# Patient Record
Sex: Male | Born: 1970
Health system: Southern US, Community
[De-identification: ages and names within clinical notes are randomized; demographics above are authoritative.]

## PROBLEM LIST (undated history)

## (undated) DIAGNOSIS — J439 Emphysema, unspecified: Secondary | ICD-10-CM

## (undated) DIAGNOSIS — J449 Chronic obstructive pulmonary disease, unspecified: Secondary | ICD-10-CM

## (undated) HISTORY — DX: Chronic obstructive pulmonary disease, unspecified: J44.9

## (undated) HISTORY — DX: Emphysema, unspecified: J43.9

---

## 2011-02-17 ENCOUNTER — Emergency Department (HOSPITAL_COMMUNITY)
Admission: EM | Admit: 2011-02-17 | Discharge: 2011-02-17 | Discharge status: Against Medical Advice | Payer: Self-pay | Attending: Emergency Medicine | Admitting: Emergency Medicine

## 2011-02-17 DIAGNOSIS — J029 Acute pharyngitis, unspecified: Secondary | ICD-10-CM | POA: Insufficient documentation

## 2011-02-17 DIAGNOSIS — R509 Fever, unspecified: Secondary | ICD-10-CM | POA: Insufficient documentation

## 2011-02-17 LAB — RAPID STREP SCREEN (MED CTR MEBANE ONLY): Streptococcus, Group A Screen (Direct): NEGATIVE

## 2014-01-29 ENCOUNTER — Emergency Department: Payer: Self-pay | Admitting: Emergency Medicine

## 2017-11-29 ENCOUNTER — Emergency Department
Admission: EM | Admit: 2017-11-29 | Discharge: 2017-11-29 | Disposition: A | Discharge status: Home / Self Care | Payer: BLUE CROSS/BLUE SHIELD | Source: Home / Self Care | Attending: Family Medicine | Admitting: Family Medicine

## 2017-11-29 ENCOUNTER — Emergency Department (INDEPENDENT_AMBULATORY_CARE_PROVIDER_SITE_OTHER): Payer: BLUE CROSS/BLUE SHIELD

## 2017-11-29 ENCOUNTER — Encounter: Payer: Self-pay | Admitting: Emergency Medicine

## 2017-11-29 DIAGNOSIS — J40 Bronchitis, not specified as acute or chronic: Secondary | ICD-10-CM

## 2017-11-29 DIAGNOSIS — R918 Other nonspecific abnormal finding of lung field: Secondary | ICD-10-CM

## 2017-11-29 DIAGNOSIS — F17200 Nicotine dependence, unspecified, uncomplicated: Secondary | ICD-10-CM | POA: Diagnosis not present

## 2017-11-29 DIAGNOSIS — R05 Cough: Secondary | ICD-10-CM | POA: Diagnosis not present

## 2017-11-29 MED ORDER — PREDNISONE 20 MG PO TABS: ORAL_TABLET | ORAL | 0 refills | Status: DC

## 2017-11-29 MED ORDER — AZITHROMYCIN 250 MG PO TABS: ORAL_TABLET | ORAL | 0 refills | Status: DC

## 2017-11-29 NOTE — ED Triage Notes (Signed)
Pt c/o cough with mucous, chest pain and ear pressure x1 month.

## 2017-11-29 NOTE — ED Provider Notes (Signed)
[facility]    CSN: [id number] Arrival date & time: 11/29/17  1656     History   Chief Complaint Chief Complaint  Patient presents with  . Cough    HPI Anthony Rocha is a 47 y.o. male.   Patient developed a cold-like illness about one month ago and complains of a persistent non-productive cough, and persistent sinus congestion.  He denies fevers, chills, and sweats.  He denies pleuritic pain, but has tightness in his anterior chest.  He continues to smoke.   The history is provided by the patient.    History reviewed. No pertinent past medical history.  There are no active problems to display for this patient.   History reviewed. No pertinent surgical history.     Home Medications    Prior to Admission medications   Medication Sig Start Date End Date Taking? Authorizing Provider  azithromycin (ZITHROMAX Z-PAK) 250 MG tablet Take 2 tabs today; then begin one tab once daily for 4 more days. 11/29/17   Lattie HawBeese, Shaunessy Dobratz A, MD  predniSONE (DELTASONE) 20 MG tablet Take one tab by mouth twice daily for 4 days, then one daily. Take with food. 11/29/17   Lattie HawBeese, Richey Doolittle A, MD    Family History History reviewed. No pertinent family history.  Social History Social History   Tobacco Use  . Smoking status: Current Every Day Smoker  . Smokeless tobacco: Never Used  Substance Use Topics  . Alcohol use: Not on file  . Drug use: Not on file     Allergies   Patient has no allergy information on record.   Review of Systems Review of Systems No sore throat + cough No pleuritic pain, but feels tightness in anterior chest No wheezing + nasal congestion + post-nasal drainage + sinus pain/pressure No itchy/red eyes ? earache No hemoptysis No SOB No fever/chills No nausea No vomiting No abdominal pain No diarrhea No urinary symptoms No skin rash + fatigue No myalgias No headache Used OTC meds without relief   Physical Exam Triage Vital  Signs ED Triage Vitals  Enc Vitals Group     BP 11/29/17 1729 133/86     Pulse Rate 11/29/17 1729 82     Resp --      Temp 11/29/17 1729 98.3 F (36.8 C)     Temp Source 11/29/17 1729 Oral     SpO2 11/29/17 1729 97 %     Weight 11/29/17 1730 190 lb (86.2 kg)     Height --      Head Circumference --      Peak Flow --      Pain Score 11/29/17 1730 0     Pain Loc --      Pain Edu? --      Excl. in GC? --    No data found.  Updated Vital Signs BP 133/86 (BP Location: Right Arm)   Pulse 82   Temp 98.3 F (36.8 C) (Oral)   Wt 190 lb (86.2 kg)   SpO2 97%   Visual Acuity Right Eye Distance:   Left Eye Distance:   Bilateral Distance:    Right Eye Near:   Left Eye Near:    Bilateral Near:     Physical Exam Nursing notes and Vital Signs reviewed. Appearance:  Patient appears stated age, and in no acute distress Eyes:  Pupils are equal, round, and reactive to light and accomodation.  Extraocular movement is intact.  Conjunctivae are not inflamed  Ears:  Canals normal.  Tympanic membranes normal.  Nose:  Mildly congested turbinates.  No sinus tenderness.  Pharynx:  Normal Neck:  Supple. No adenopathy. Lungs:  Clear to auscultation.  Breath sounds are equal.  Moving air well. Heart:  Regular rate and rhythm without murmurs, rubs, or gallops.  Abdomen:  Nontender without masses or hepatosplenomegaly.  Bowel sounds are present.  No CVA or flank tenderness.  Extremities:  No edema.  Skin:  No rash present.    UC Treatments / Results  Labs (all labs ordered are listed, but only abnormal results are displayed) Labs Reviewed - No data to display  EKG  EKG Interpretation None       Radiology Dg Chest 2 View  Result Date: 11/29/2017 CLINICAL DATA:  Persistent cough for 1 month.  Current smoker. EXAM: CHEST - 2 VIEW COMPARISON:  None. FINDINGS: Normal heart size. Normal mediastinal contour. No pneumothorax. No pleural effusion. Mildly hyperinflated lungs. No pulmonary  edema. Tiny nodular densities throughout both lungs measuring up to 3 mm in size. No acute consolidative airspace disease. No pulmonary edema. IMPRESSION: 1. Nonspecific tiny nodular densities throughout both lungs measuring up to 3 mm in size. While most likely to represent tiny benign granulomas, nonspecific bronchiolitis cannot be excluded. Recommend either short-term follow-up PA and lateral chest radiographs in 2-3 months or further evaluation with chest CT, as clinically warranted. 2. No acute consolidative airspace disease to suggest a pneumonia. 3. Mildly hyperinflated lungs, suggesting COPD. Electronically Signed   By: Delbert Phenix M.D.   On: 11/29/2017 18:06    Procedures Procedures (including critical care time)  Medications Ordered in UC Medications - No data to display   Initial Impression / Assessment and Plan / UC Course  I have reviewed the triage vital signs and the nursing notes.  Pertinent labs & imaging results that were available during my care of the patient were reviewed by me and considered in my medical decision making (see chart for details).    Note abnormal chest x-ray findings. Begin Z-pak for atypical coverage, and prednisone burst/taper. Take plain guaifenesin (1200mg  extended release tabs such as Mucinex) twice daily, with plenty of water, for cough and congestion.   Get adequate rest.   For nasal congestion may use Afrin nasal spray (or generic oxymetazoline) each morning for about 5 days and then discontinue.  Also recommend using saline nasal spray several times daily and saline nasal irrigation (AYR is a common brand).  Use Flonase nasal spray each morning after using Afrin nasal spray and saline nasal irrigation. Try warm salt water gargles for sore throat.  Stop all antihistamines for now, and other non-prescription cough/cold preparations. May take Delsym Cough Suppressant at bedtime for nighttime cough.   Recommend follow-up with PCP for repeat chest  X-ray about 2 months, and to establish care.  Recommend smoking cessation program.    Final Clinical Impressions(s) / UC Diagnoses   Final diagnoses:  Bronchitis    ED Discharge Orders        Ordered    azithromycin (ZITHROMAX Z-PAK) 250 MG tablet     11/29/17 1829    predniSONE (DELTASONE) 20 MG tablet     11/29/17 1829          Lattie Haw, MD 12/01/17 1237

## 2017-11-29 NOTE — Discharge Instructions (Signed)
Take plain guaifenesin (1200mg  extended release tabs such as Mucinex) twice daily, with plenty of water, for cough and congestion.   Get adequate rest.   For nasal congestion may use Afrin nasal spray (or generic oxymetazoline) each morning for about 5 days and then discontinue.  Also recommend using saline nasal spray several times daily and saline nasal irrigation (AYR is a common brand).  Use Flonase nasal spray each morning after using Afrin nasal spray and saline nasal irrigation. Try warm salt water gargles for sore throat.  Stop all antihistamines for now, and other non-prescription cough/cold preparations. May take Delsym Cough Suppressant at bedtime for nighttime cough.

## 2017-12-18 ENCOUNTER — Emergency Department
Admission: EM | Admit: 2017-12-18 | Discharge: 2017-12-18 | Disposition: A | Discharge status: Home / Self Care | Payer: BLUE CROSS/BLUE SHIELD | Source: Home / Self Care

## 2017-12-18 ENCOUNTER — Emergency Department (INDEPENDENT_AMBULATORY_CARE_PROVIDER_SITE_OTHER): Payer: BLUE CROSS/BLUE SHIELD

## 2017-12-18 ENCOUNTER — Encounter: Payer: Self-pay | Admitting: Emergency Medicine

## 2017-12-18 DIAGNOSIS — R05 Cough: Secondary | ICD-10-CM | POA: Diagnosis not present

## 2017-12-18 DIAGNOSIS — J439 Emphysema, unspecified: Secondary | ICD-10-CM | POA: Diagnosis not present

## 2017-12-18 DIAGNOSIS — J441 Chronic obstructive pulmonary disease with (acute) exacerbation: Secondary | ICD-10-CM

## 2017-12-18 MED ORDER — ALBUTEROL SULFATE HFA 108 (90 BASE) MCG/ACT IN AERS: 1.0000 | INHALATION_SPRAY | Freq: Four times a day (QID) | RESPIRATORY_TRACT | 0 refills | Status: DC | PRN

## 2017-12-18 MED ORDER — DOXYCYCLINE HYCLATE 100 MG PO CAPS: 100.0000 mg | ORAL_CAPSULE | Freq: Two times a day (BID) | ORAL | 0 refills | Status: DC

## 2017-12-18 MED ORDER — PREDNISONE 50 MG PO TABS: 50.0000 mg | ORAL_TABLET | Freq: Every day | ORAL | 0 refills | Status: DC

## 2017-12-18 MED ORDER — IPRATROPIUM-ALBUTEROL 0.5-2.5 (3) MG/3ML IN SOLN
3.0000 mL | Freq: Four times a day (QID) | RESPIRATORY_TRACT | Status: DC
Start: 2017-12-18 — End: 2017-12-18
  Administered 2017-12-18: 3 mL via RESPIRATORY_TRACT

## 2017-12-18 NOTE — ED Triage Notes (Signed)
Pt states he took meds given at 3/11 visit. Felt better then sxs returned this week. Cough and sinus pressure. Afebrile.

## 2017-12-20 NOTE — ED Provider Notes (Signed)
[facility]    CSN: [id number] Arrival date & time: 12/18/17  1250     History   Chief Complaint Chief Complaint  Patient presents with  . Facial Pain    HPI Anthony Rocha is a 47 y.o. male.   The history is provided by the patient. No language interpreter was used.  Shortness of Breath  Severity:  Moderate Onset quality:  Gradual Duration:  3 weeks Timing:  Constant Progression:  Worsening Chronicity:  New Context: not activity   Relieved by:  Nothing Worsened by:  Smoke exposure Ineffective treatments:  None tried Associated symptoms: wheezing   Associated symptoms: no abdominal pain   Risk factors: tobacco use   Pt reports he was treated 2 weeks ago for bronchitis.  Pt reports he felt better after taking antibiotics and prednisone.  Pt reports symptoms have returned and he has increased shortness of breath.   History reviewed. No pertinent past medical history.  There are no active problems to display for this patient.   History reviewed. No pertinent surgical history.     Home Medications    Prior to Admission medications   Medication Sig Start Date End Date Taking? Authorizing Provider  albuterol (PROVENTIL HFA;VENTOLIN HFA) 108 (90 Base) MCG/ACT inhaler Inhale 1-2 puffs into the lungs every 6 (six) hours as needed for wheezing or shortness of breath. 12/18/17   Elson Areas, PA-C  doxycycline (VIBRAMYCIN) 100 MG capsule Take 1 capsule (100 mg total) by mouth 2 (two) times daily. 12/18/17   Elson Areas, PA-C  predniSONE (DELTASONE) 50 MG tablet Take 1 tablet (50 mg total) by mouth daily. 12/18/17   Elson Areas, PA-C    Family History History reviewed. No pertinent family history.  Social History Social History   Tobacco Use  . Smoking status: Current Every Day Smoker    Types: Cigarettes  . Smokeless tobacco: Never Used  Substance Use Topics  . Alcohol use: Yes  . Drug use: Not on file     Allergies   Patient has  no allergy information on record.   Review of Systems Review of Systems  Respiratory: Positive for shortness of breath and wheezing.   Gastrointestinal: Negative for abdominal pain.  All other systems reviewed and are negative.    Physical Exam Triage Vital Signs ED Triage Vitals  Enc Vitals Group     BP 12/18/17 1326 111/70     Pulse Rate 12/18/17 1326 95     Resp --      Temp 12/18/17 1326 98.6 F (37 C)     Temp Source 12/18/17 1326 Oral     SpO2 12/18/17 1326 98 %     Weight 12/18/17 1327 186 lb (84.4 kg)     Height --      Head Circumference --      Peak Flow --      Pain Score 12/18/17 1327 0     Pain Loc --      Pain Edu? --      Excl. in GC? --    No data found.  Updated Vital Signs BP 111/70 (BP Location: Right Arm)   Pulse 95   Temp 98.6 F (37 C) (Oral)   Wt 186 lb (84.4 kg)   SpO2 98%   Visual Acuity Right Eye Distance:   Left Eye Distance:   Bilateral Distance:    Right Eye Near:   Left Eye Near:    Bilateral Near:  Physical Exam  Constitutional: He appears well-developed and well-nourished.  HENT:  Head: Normocephalic and atraumatic.  Eyes: Conjunctivae are normal.  Neck: Neck supple.  Cardiovascular: Normal rate and regular rhythm.  No murmur heard. Pulmonary/Chest: Effort normal. No respiratory distress. He has wheezes.  Abdominal: Soft. There is no tenderness.  Musculoskeletal: He exhibits no edema.  Neurological: He is alert.  Skin: Skin is warm and dry.  Psychiatric: He has a normal mood and affect.  Nursing note and vitals reviewed.    UC Treatments / Results  Labs (all labs ordered are listed, but only abnormal results are displayed) Labs Reviewed - No data to display  EKG None Radiology No results found.  Procedures Procedures (including critical care time)  Medications Ordered in UC Medications - No data to display   Initial Impression / Assessment and Plan / UC Course  I have reviewed the triage vital  signs and the nursing notes.  Pertinent labs & imaging results that were available during my care of the patient were reviewed by me and considered in my medical decision making (see chart for details).     Pt given duoneb.  Pt reports no improvement.  On reexam pt has slight decrease in wheezing.  I counseled pt on COPD and smoking.  I will treat with albuterol, doxycycline and prednisone.  Pt is advised he needs to stop smoking.  I gave pt numbers for primary care.  He will follow up for help with stopping smoking.   Final Clinical Impressions(s) / UC Diagnoses   Final diagnoses:  COPD exacerbation [facility](HCC)    ED Discharge Orders        Ordered    albuterol (PROVENTIL HFA;VENTOLIN HFA) 108 (90 Base) MCG/ACT inhaler  Every 6 hours PRN     12/18/17 1437    doxycycline (VIBRAMYCIN) 100 MG capsule  2 times daily     12/18/17 1437    predniSONE (DELTASONE) 50 MG tablet  Daily     12/18/17 1437      An After Visit Summary was printed and given to the patient.  Controlled Substance Prescriptions [place] Controlled Substance Registry consulted? Not Applicable   Elson AreasSofia, Leslie K, [place]PA-C 12/20/17 [id number]

## 2017-12-29 ENCOUNTER — Encounter: Payer: Self-pay | Admitting: Osteopathic Medicine

## 2017-12-29 ENCOUNTER — Encounter (INDEPENDENT_AMBULATORY_CARE_PROVIDER_SITE_OTHER): Payer: Self-pay

## 2017-12-29 ENCOUNTER — Ambulatory Visit (INDEPENDENT_AMBULATORY_CARE_PROVIDER_SITE_OTHER): Payer: BLUE CROSS/BLUE SHIELD | Admitting: Osteopathic Medicine

## 2017-12-29 VITALS — BP 130/94 | HR 87 | Temp 98.4°F | Ht 70.0 in | Wt 188.0 lb

## 2017-12-29 DIAGNOSIS — R350 Frequency of micturition: Secondary | ICD-10-CM | POA: Diagnosis not present

## 2017-12-29 DIAGNOSIS — J439 Emphysema, unspecified: Secondary | ICD-10-CM

## 2017-12-29 MED ORDER — VARENICLINE TARTRATE 1 MG PO TABS: 1.0000 mg | ORAL_TABLET | Freq: Two times a day (BID) | ORAL | 1 refills | Status: DC

## 2017-12-29 MED ORDER — VARENICLINE TARTRATE 0.5 MG X 11 & 1 MG X 42 PO MISC: ORAL | 0 refills | Status: DC

## 2017-12-29 NOTE — Patient Instructions (Signed)

## 2017-12-29 NOTE — Progress Notes (Signed)
HPI: Anthony Rocha is a 47 y.o. male who  has no past medical history on file.  he presents to [facility] today, 12/29/17,  for chief complaint of: New to establish COPD  Pleasant patient here to establish care. Hes quite anxious about lung disease, new issue.   COPD: new diagnosis from urgent care, see below for record review. No spirometry results available. Smoking history: 2 pdd x 30 years. Was told he might need referral to lung specialist.  Overall, he does not struggle with shortness of breath on exertion or chronic cough. He states the albuterol inhaler did not seem to make any difference in terms of his breathing.he used Chantix years ago with good success, was able to quit for a while until increased stress with going through divorce. Like to get back on Chantix.   Records reviewed  Seen twice in the past month for respiratory issues at urgent care next door.  11/29/2017: cold-like illness for about a month, persistent productive cough.abnormal chest x-ray see below, azithromycin and prednisone were prescribed  IMPRESSION:1. Nonspecific tiny nodular densities throughout both lungs measuring up to 3 mm in size. While most likely to represent tiny benign granulomas, nonspecific bronchiolitis cannot be excluded. Recommend either short-term follow-up PA and lateral chest radiographs in 2-3 months or further evaluation with chest CT, as clinically warranted. 2. No acute consolidative airspace disease to suggest a pneumonia. 3. Mildly hyperinflated lungs, suggesting COPD.  12/18/2017:shortness of breath for about 3 weeks worsening,was initially feeling better after treatment from visit above but reported returned symptoms and increased shortness of breath. CXR positive for COPD/emphysema without evidence of acute disease. Albuterol, doxycycline, prednisone were prescribed, tobacco cessation was discussed.  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   No past surgical history on file.  Social History   Tobacco Use  . Smoking status: Current Every Day Smoker    Types: Cigarettes  . Smokeless tobacco: Never Used  Substance Use Topics  . Alcohol use: Yes    No family history on file.   Current medication list and allergy/intolerance information reviewed:    Current Outpatient Medications  Medication Sig Dispense Refill  . albuterol (PROVENTIL HFA;VENTOLIN HFA) 108 (90 Base) MCG/ACT inhaler Inhale 1-2 puffs into the lungs every 6 (six) hours as needed for wheezing or shortness of breath. 1 Inhaler 0  . doxycycline (VIBRAMYCIN) 100 MG capsule Take 1 capsule (100 mg total) by mouth 2 (two) times daily. 20 capsule 0  . predniSONE (DELTASONE) 50 MG tablet Take 1 tablet (50 mg total) by mouth daily. 5 tablet 0   No current facility-administered medications for this visit.     Not on File    Review of Systems:  Constitutional:  No  fever, no chills, No recent illness, No unintentional weight changes. No significant fatigue.   HEENT: No  headache, no vision change, no hearing change, No sore throat, No  sinus pressure  Cardiac: No  chest pain, No  pressure, No palpitations, No  Orthopnea  Respiratory:  No  shortness of breath. No  Cough  Gastrointestinal: No  abdominal pain, No  nausea, No  vomiting,  No  blood in stool, No  diarrhea, No  constipation   Musculoskeletal: No  new myalgia/arthralgia  Skin: No  Rash, No other wounds/concerning lesions  Genitourinary: No  incontinence, No  abnormal genital bleeding, No abnormal genital discharge  Hem/Onc: No  easy bruising/bleeding, No  abnormal lymph node  Endocrine: No cold intolerance,  No heat intolerance. No polyuria/polydipsia/polyphagia    Neurologic: No  weakness, No  dizziness, No  slurred speech/focal weakness/facial droop  Psychiatric: No  concerns with depression, No  concerns with anxiety, No sleep problems, No mood problems  Exam:  BP (!) 130/94 (BP Location: Right Arm, Patient Position: Sitting, Cuff Size: Normal)   Pulse 87   Temp 98.4 F (36.9 C) (Oral)   Ht 5\' 10"  (1.778 m)   Wt 188 lb 0.6 oz (85.3 kg)   BMI 26.98 kg/m   Constitutional: VS see above. General Appearance: alert, well-developed, well-nourished, NAD  Eyes: Normal lids and conjunctive, non-icteric sclera  Ears, Nose, Mouth, Throat: MMM, Normal external inspection ears/nares/mouth/lips/gums.  Neck: No masses, trachea midline.   Respiratory: Normal respiratory effort. no wheeze, no rhonchi, no rales  Cardiovascular: S1/S2 normal, no murmur, no rub/gallop auscultated. RRR. No lower extremity edema  Musculoskeletal: Gait normal.   Neurological: Normal balance/coordination. No tremor.  Skin: warm, dry, intact. No rash/ulcer.    Psychiatric: Normal judgment/insight. Normal mood and affect. Oriented x3.   Personally reviewed chest x-rays from urgent care. Report on the second exam does not make mention of the nodule that was labeled on the initial x-ray, however on personal visual review it appears stable to me.   ASSESSMENT/PLAN:   Pulmonary emphysema, unspecified emphysema type (HCC) - discussed smoking cessation as most important intervention, RTC for PFT and chest x-ray. If nodule persists, would get CT chest - Plan: CBC, COMPLETE METABOLIC PANEL WITH GFR, Lipid panel  Frequent urination - Plan: Urinalysis, Routine w reflex microscopic   Meds ordered this encounter  Medications  . varenicline (CHANTIX CONTINUING MONTH PAK) 1 MG tablet    Sig: Take 1 tablet (1 mg total) by mouth 2 (two) times daily.    Dispense:  60 tablet    Refill:  1  . varenicline (CHANTIX STARTING MONTH PAK) 0.5 MG X 11 & 1 MG X 42 tablet    Sig: Take one  0.5 mg tablet by mouth once daily for 3 days, then increase to one 0.5 mg tablet twice daily for 4 days, then increase to one 1 mg tablet twice daily.    Dispense:  53 tablet    Refill:  0       Visit summary with medication list and pertinent instructions was printed for patient to review. All questions at time of visit were answered - patient instructed to contact office with any additional concerns. ER/RTC precautions were reviewed with the patient.   Follow-up plan: Return in about 1 month (around 01/26/2018) for lung function testing, repeat chest Xray.    Please note: voice recognition software was used to produce this document, and typos may escape review. Please contact Dr. Lyn HollingsheadAlexander for any needed clarifications.

## 2017-12-30 LAB — COMPLETE METABOLIC PANEL WITH GFR
AG Ratio: 2 (calc) (ref 1.0–2.5)
ALT: 22 U/L (ref 9–46)
AST: 15 U/L (ref 10–40)
Albumin: 4.3 g/dL (ref 3.6–5.1)
Alkaline phosphatase (APISO): 69 U/L (ref 40–115)
BUN: 12 mg/dL (ref 7–25)
CO2: 29 mmol/L (ref 20–32)
Calcium: 9.1 mg/dL (ref 8.6–10.3)
Chloride: 105 mmol/L (ref 98–110)
Creat: 0.92 mg/dL (ref 0.60–1.35)
GFR, Est African American: 114 mL/min/{1.73_m2} (ref >=60)
GFR, Est Non African American: 98 mL/min/{1.73_m2} (ref >=60)
Globulin: 2.2 g/dL (calc) (ref 1.9–3.7)
Glucose, Bld: 81 mg/dL (ref 65–99)
Potassium: 4.4 mmol/L (ref 3.5–5.3)
Sodium: 139 mmol/L (ref 135–146)
Total Bilirubin: 0.5 mg/dL (ref 0.2–1.2)
Total Protein: 6.5 g/dL (ref 6.1–8.1)

## 2017-12-30 LAB — URINALYSIS, ROUTINE W REFLEX MICROSCOPIC
Bilirubin Urine: NEGATIVE
Glucose, UA: NEGATIVE
Hgb urine dipstick: NEGATIVE
Ketones, ur: NEGATIVE
Leukocytes, UA: NEGATIVE
Nitrite: NEGATIVE
Protein, ur: NEGATIVE
Specific Gravity, Urine: 1.012 (ref 1.001–1.03)
pH: 7 (ref 5.0–8.0)

## 2017-12-30 LAB — CBC
HCT: 44.9 % (ref 38.5–50.0)
Hemoglobin: 15.6 g/dL (ref 13.2–17.1)
MCH: 29.2 pg (ref 27.0–33.0)
MCHC: 34.7 g/dL (ref 32.0–36.0)
MCV: 83.9 fL (ref 80.0–100.0)
MPV: 10.5 fL (ref 7.5–12.5)
Platelets: 202 10*3/uL (ref 140–400)
RBC: 5.35 10*6/uL (ref 4.20–5.80)
RDW: 13.8 % (ref 11.0–15.0)
WBC: 9.7 10*3/uL (ref 3.8–10.8)

## 2017-12-30 LAB — LIPID PANEL
Cholesterol: 196 mg/dL (ref <200)
HDL: 52 mg/dL (ref >40)
LDL Cholesterol (Calc): 123 mg/dL (calc) — ABNORMAL HIGH
Non-HDL Cholesterol (Calc): 144 mg/dL (calc) — ABNORMAL HIGH (ref <130)
Total CHOL/HDL Ratio: 3.8 (calc) (ref <5.0)
Triglycerides: 99 mg/dL (ref <150)

## 2018-01-28 ENCOUNTER — Other Ambulatory Visit: Payer: BLUE CROSS/BLUE SHIELD

## 2018-02-16 ENCOUNTER — Other Ambulatory Visit: Payer: BLUE CROSS/BLUE SHIELD

## 2018-02-18 ENCOUNTER — Ambulatory Visit (INDEPENDENT_AMBULATORY_CARE_PROVIDER_SITE_OTHER): Payer: BLUE CROSS/BLUE SHIELD

## 2018-02-18 ENCOUNTER — Ambulatory Visit (INDEPENDENT_AMBULATORY_CARE_PROVIDER_SITE_OTHER): Payer: BLUE CROSS/BLUE SHIELD | Admitting: Osteopathic Medicine

## 2018-02-18 ENCOUNTER — Encounter: Payer: Self-pay | Admitting: Internal Medicine

## 2018-02-18 VITALS — BP 134/90 | HR 82 | Ht 70.0 in | Wt 195.0 lb

## 2018-02-18 DIAGNOSIS — Z23 Encounter for immunization: Secondary | ICD-10-CM

## 2018-02-18 DIAGNOSIS — R918 Other nonspecific abnormal finding of lung field: Secondary | ICD-10-CM

## 2018-02-18 DIAGNOSIS — J449 Chronic obstructive pulmonary disease, unspecified: Secondary | ICD-10-CM | POA: Diagnosis not present

## 2018-02-18 DIAGNOSIS — R9389 Abnormal findings on diagnostic imaging of other specified body structures: Secondary | ICD-10-CM | POA: Diagnosis not present

## 2018-02-18 MED ORDER — ALBUTEROL SULFATE (2.5 MG/3ML) 0.083% IN NEBU
2.5000 mg | INHALATION_SOLUTION | Freq: Once | RESPIRATORY_TRACT | Status: AC
  Administered 2018-02-18: 2.5 mg via RESPIRATORY_TRACT

## 2018-02-18 NOTE — Addendum Note (Signed)
Addended by: Deirdre Pippins on: 02/18/2018 01:58 PM   Modules accepted: Orders

## 2018-02-18 NOTE — Progress Notes (Signed)
HPI: Anthony Rocha is a 47 y.o. male who  has no past medical history on file.  he presents to [facility] today, 02/18/18,  for chief complaint of: COPD  COPD: new diagnosis from urgent care 12/2017, see below for record review. No spirometry results available so he is here today for PFT.   Smoking history: 2 pdd x 30 years. Was told he might need referral to lung specialist.  Overall, he does not struggle with shortness of breath on exertion or chronic cough. He states the albuterol inhaler did not seem to make any difference in terms of his breathing.he used Chantix years ago with good success, was able to quit for a while until increased stress with going through divorce - we restarted Chantix and now has quit smoking, yay!   PFT/spirometry indicates severe obstruction, possible restrictive component.   Discussed inhalers, see below.  Reports he is able to perform all activities of daily living without any breathing restrictions, cough has decreased quite a bit since quitting smoking.  He really does not want to be on any medications, including inhalers       Past medical, surgical, social and family history reviewed:  Patient Active Problem List   Diagnosis Date Noted  . COPD, severe (HCC) 02/18/2018  . Abnormal chest x-ray 02/18/2018   No past surgical history on file.  Social History   Tobacco Use  . Smoking status: Former Smoker    Packs/day: 2.00    Years: 30.00    Pack years: 60.00    Types: Cigarettes    Last attempt to quit: 01/07/2018    Years since quitting: 0.1  . Smokeless tobacco: Never Used  Substance Use Topics  . Alcohol use: Yes    Comment: 12 drinks/wk   No family history on file.   Current medication list and allergy/intolerance information reviewed:    Current Outpatient Medications  Medication Sig Dispense Refill  . varenicline (CHANTIX CONTINUING MONTH PAK) 1 MG tablet Take 1 tablet (1 mg total) by  mouth 2 (two) times daily. 60 tablet 1   No current facility-administered medications for this visit.     No Known Allergies    Review of Systems:  Constitutional:  No  fever, no chills, No recent illness, No unintentional weight changes. No significant fatigue.   HEENT: No  headache, no vision change  Cardiac: No  chest pain, No  pressure, No palpitations,  Respiratory:  No  shortness of breath. +occasional Cough  Gastrointestinal: No  abdominal pain  Musculoskeletal: No new myalgia/arthralgia  Neurologic: No  weakness, No  dizziness  Psychiatric: No  concerns with depression, No  concerns with anxiety  Exam:  BP 134/90   Pulse 82   Ht  (1.778 m)   Wt 195 lb (88.5 kg)   SpO2 100%   BMI 27.98 kg/m   Constitutional: VS see above. General Appearance: alert, well-developed, well-nourished, NAD  Eyes: Normal lids and conjunctive, non-icteric sclera  Ears, Nose, Mouth, Throat: MMM, Normal external inspection ears/nares/mouth/lips/gums.  Neck: No masses, trachea midline.   Respiratory: Normal respiratory effort. no wheeze, no rhonchi, no rales, diminished breath sounds bilaterally  Cardiovascular: S1/S2 normal, no murmur, no rub/gallop auscultated. RRR. No lower extremity edema  Musculoskeletal: Gait normal.   Neurological: Normal balance/coordination. No tremor.  Skin: warm, dry, intact. No rash/ulcer.    Psychiatric: Normal judgment/insight. Normal mood and affect. Oriented x3.   Personally reviewed chest x-rays from urgent care. Report  on the second exam does not make mention of the nodule that was labeled on the initial x-ray, however on personal visual review it appears stable to me.     ASSESSMENT/PLAN:   COPD, severe (HCC) - Patient would like to hold off on inhalers/meds for now.  Advised annual flu shot, pneumonia shot now.  Has quit smoking, which is excellent!  Advised on risks versus benefits of foregoing inhaler treatment, I think probably  okay for now since he is relatively asymptomatic but he is to alert me if this changes.  We discussed follow-up protocol as well for any concern for exacerbation/respiratory illness he needs to come see me right away.- Plan: albuterol (PROVENTIL) (2.5 MG/3ML) 0.083% nebulizer solution 2.5 mg, PR EVAL OF BRONCHOSPASM  Abnormal chest x-ray - Will get repeat today, if questionable finding still present, will proceed to CT - Plan: DG Chest 2 View  Need for pneumococcal vaccination - Plan: Pneumococcal polysaccharide vaccine 23-valent greater than or equal to 2yo subcutaneous/IM       Visit summary with medication list and pertinent instructions was printed for patient to review. All questions at time of visit were answered - patient instructed to contact office with any additional concerns. ER/RTC precautions were reviewed with the patient.   Follow-up plan: Return in about 5 months (around 07/21/2018) for follow-up COPD, sooner if needed .    Please note: voice recognition software was used to produce this document, and typos may escape review. Please contact Dr. Lyn Hollingshead for any needed clarifications.

## 2018-02-21 ENCOUNTER — Ambulatory Visit (INDEPENDENT_AMBULATORY_CARE_PROVIDER_SITE_OTHER): Payer: BLUE CROSS/BLUE SHIELD

## 2018-02-21 DIAGNOSIS — R918 Other nonspecific abnormal finding of lung field: Secondary | ICD-10-CM | POA: Diagnosis not present

## 2018-02-21 DIAGNOSIS — J439 Emphysema, unspecified: Secondary | ICD-10-CM

## 2018-02-21 DIAGNOSIS — K8020 Calculus of gallbladder without cholecystitis without obstruction: Secondary | ICD-10-CM

## 2018-03-19 ENCOUNTER — Telehealth: Payer: Self-pay | Admitting: Family Medicine

## 2018-03-19 MED ORDER — PREDNISONE 50 MG PO TABS: 50.0000 mg | ORAL_TABLET | Freq: Every day | ORAL | 0 refills | Status: DC

## 2018-03-19 MED ORDER — ALBUTEROL SULFATE HFA 108 (90 BASE) MCG/ACT IN AERS
2.0000 | INHALATION_SPRAY | Freq: Four times a day (QID) | RESPIRATORY_TRACT | 0 refills | Status: DC | PRN
Start: 2018-03-19 — End: 2018-07-20

## 2018-03-19 MED ORDER — AZITHROMYCIN 250 MG PO TABS: 250.0000 mg | ORAL_TABLET | Freq: Every day | ORAL | 0 refills | Status: DC

## 2018-03-19 NOTE — Telephone Encounter (Signed)
Pt called after hours line. He has productive cough and was just diagnosed with COPD via spirometry. Plan albuterol, prednisone, and azithromycin.  Pt will call and schedule follow up.

## 2018-07-20 ENCOUNTER — Ambulatory Visit: Payer: BLUE CROSS/BLUE SHIELD | Admitting: Osteopathic Medicine

## 2018-07-20 ENCOUNTER — Encounter: Payer: Self-pay | Admitting: Osteopathic Medicine

## 2018-07-20 VITALS — BP 130/91 | HR 74 | Temp 97.4°F

## 2018-07-20 DIAGNOSIS — Z789 Other specified health status: Secondary | ICD-10-CM

## 2018-07-20 DIAGNOSIS — R9389 Abnormal findings on diagnostic imaging of other specified body structures: Secondary | ICD-10-CM

## 2018-07-20 DIAGNOSIS — J439 Emphysema, unspecified: Secondary | ICD-10-CM | POA: Diagnosis not present

## 2018-07-20 DIAGNOSIS — Z7289 Other problems related to lifestyle: Secondary | ICD-10-CM | POA: Insufficient documentation

## 2018-07-20 DIAGNOSIS — Z23 Encounter for immunization: Secondary | ICD-10-CM

## 2018-07-20 MED ORDER — ALBUTEROL SULFATE 108 (90 BASE) MCG/ACT IN AEPB: 1.0000 | INHALATION_SPRAY | RESPIRATORY_TRACT | 11 refills | Status: DC | PRN

## 2018-07-20 NOTE — Progress Notes (Signed)
HPI: Anthony Rocha is a 47 y.o. male who  has no past medical history on file.  he presents to [facility] today, 07/20/18,  for chief complaint of:  COPD follow-up  Last visit several months ago - initiated inhaler prn and Chantix for smoking cessation. Has quit cigarettes! Vapes occasionally. No breathing troubles, occasional rescue inhaler use but takes about 4 puffs before he feels it's working. No limitations to ADL's.     Past medical history, surgical history, and family history reviewed.  Current medication list and allergy/intolerance information reviewed.   (See remainder of HPI, ROS, Phys Exam below)   Immunization History  Administered Date(s) Administered  . Influenza,inj,Quad PF,6+ Mos 07/20/2018  . Influenza-Unspecified 08/21/2017  . Pneumococcal Polysaccharide-23 02/18/2018    02/21/2018 EXAM: CT CHEST WITHOUT CONTRAST  TECHNIQUE: Multidetector CT imaging of the chest was performed following the standard protocol without IV contrast. COMPARISON:  Radiographs of Feb 18, 2018.  FINDINGS: Cardiovascular: No significant vascular findings. Normal heart size. No pericardial effusion. Mediastinum/Nodes: No enlarged mediastinal or axillary lymph nodes. Thyroid gland, trachea, and esophagus demonstrate no significant findings. Lungs/Pleura: No pneumothorax or pleural effusion is noted. Multiple small calcified granulomata are noted bilaterally. Emphysematous disease is noted in the upper lobes bilaterally. Upper Abdomen: Cholelithiasis is noted without inflammation. No other abnormality seen in the upper abdomen. Musculoskeletal: No chest wall mass or suspicious bone lesions identified.  IMPRESSION: Multiple small calcified granulomata are noted bilaterally suggesting prior granulomatous disease. No acute abnormality is seen in the chest. Cholelithiasis without inflammation in visualized portion of  upper abdomen. Emphysema (ICD10-J43.9).    ASSESSMENT/PLAN:   Pulmonary emphysema, unspecified emphysema type (HCC)  Abnormal chest x-ray  Need for influenza vaccination - Plan: Flu Vaccine QUAD 6+ mos PF IM (Fluarix Quad PF)  Non-nicotine vapor product user   Meds ordered this encounter  Medications  . Albuterol Sulfate (PROAIR RESPICLICK) 108 (90 Base) MCG/ACT AEPB    Sig: Inhale 1-2 puffs into the lungs every 4 (four) hours as needed (wheeze/short of breath).    Dispense:  2 each    Refill:  11   Trial respiclick device or pt educated on spacer use. If symptoms worsen, please come see me and we can discuss maintenance inhaler, which he declines at this time.    Follow-up plan: Return in about 6 months (around 01/19/2019) for ANNUAL PHYSICAL / WELLNESS AND ROUTINE FASTING LABS - sooner if needed! .                   ############################################ ############################################ ############################################ ############################################    Outpatient Encounter Medications as of 07/20/2018  Medication Sig  . albuterol (PROVENTIL HFA;VENTOLIN HFA) 108 (90 Base) MCG/ACT inhaler Inhale 2 puffs into the lungs every 6 (six) hours as needed for wheezing or shortness of breath.  Marland Kitchen azithromycin (ZITHROMAX) 250 MG tablet Take 1 tablet (250 mg total) by mouth daily. Take first 2 tablets together, then 1 every day until finished.  . predniSONE (DELTASONE) 50 MG tablet Take 1 tablet (50 mg total) by mouth daily.  . varenicline (CHANTIX CONTINUING MONTH PAK) 1 MG tablet Take 1 tablet (1 mg total) by mouth 2 (two) times daily.   No facility-administered encounter medications on file as of 07/20/2018.    No Known Allergies    Review of Systems:  Constitutional: No recent illness  HEENT: No  headache, no vision change  Cardiac: No  chest pain, No  pressure, No palpitations  Respiratory:  No  shortness of  breath. No  Cough  Gastrointestinal: No  abdominal pain, no change on bowel habits  Musculoskeletal: No new myalgia/arthralgia  Skin: No  Rash  Hem/Onc: No  easy bruising/bleeding, No  abnormal lumps/bumps  Neurologic: No  weakness, No  Dizziness  Psychiatric: No  concerns with depression, No  concerns with anxiety  Exam:  BP (!) 130/91   Pulse 74   Temp (!) 97.4 F (36.3 C) (Oral)   SpO2 99%   Constitutional: VS see above. General Appearance: alert, well-developed, well-nourished, NAD  Eyes: Normal lids and conjunctive, non-icteric sclera  Ears, Nose, Mouth, Throat: MMM, Normal external inspection ears/nares/mouth/lips/gums.  Neck: No masses, trachea midline.   Respiratory: Normal respiratory effort. no wheeze, no rhonchi, no rales  Cardiovascular: S1/S2 normal, no murmur, no rub/gallop auscultated. RRR.   Musculoskeletal: Gait normal. Symmetric and independent movement of all extremities  Neurological: Normal balance/coordination. No tremor.  Skin: warm, dry, intact.   Psychiatric: Normal judgment/insight. Normal mood and affect. Oriented x3.   Visit summary with medication list and pertinent instructions was printed for patient to review, advised to alert [place] if any changes needed. All questions at time of visit were answered - patient instructed to contact office with any additional concerns. ER/RTC precautions were reviewed with the patient and understanding verbalized.   Follow-up plan: Return in about 6 months (around 01/19/2019) for ANNUAL PHYSICAL / WELLNESS AND ROUTINE FASTING LABS - sooner if needed! .  Note: Total time spent 25 minutes, greater than 50% of the visit was spent face-to-face counseling and coordinating care for the following: The primary encounter diagnosis was Pulmonary emphysema, unspecified emphysema type (HCC). Diagnoses of Abnormal chest x-ray, Need for influenza vaccination, and Non-nicotine vapor product user were also pertinent to this  visit.Marland Kitchen  Please note: voice recognition software was used to produce this document, and typos may escape review. Please contact Dr. Lyn Hollingshead for any needed clarifications.

## 2018-07-21 ENCOUNTER — Ambulatory Visit: Payer: BLUE CROSS/BLUE SHIELD | Admitting: Osteopathic Medicine

## 2019-01-19 ENCOUNTER — Encounter: Payer: Self-pay | Admitting: Osteopathic Medicine

## 2019-01-19 ENCOUNTER — Ambulatory Visit (INDEPENDENT_AMBULATORY_CARE_PROVIDER_SITE_OTHER): Payer: BLUE CROSS/BLUE SHIELD | Admitting: Osteopathic Medicine

## 2019-01-19 VITALS — BP 147/83 | HR 89 | Temp 98.6°F | Wt 210.0 lb

## 2019-01-19 DIAGNOSIS — F419 Anxiety disorder, unspecified: Secondary | ICD-10-CM

## 2019-01-19 DIAGNOSIS — J449 Chronic obstructive pulmonary disease, unspecified: Secondary | ICD-10-CM

## 2019-01-19 DIAGNOSIS — Z0000 Encounter for general adult medical examination without abnormal findings: Secondary | ICD-10-CM | POA: Diagnosis not present

## 2019-01-19 DIAGNOSIS — R030 Elevated blood-pressure reading, without diagnosis of hypertension: Secondary | ICD-10-CM | POA: Diagnosis not present

## 2019-01-19 DIAGNOSIS — R351 Nocturia: Secondary | ICD-10-CM | POA: Diagnosis not present

## 2019-01-19 DIAGNOSIS — Z23 Encounter for immunization: Secondary | ICD-10-CM | POA: Diagnosis not present

## 2019-01-19 MED ORDER — ESCITALOPRAM OXALATE 5 MG PO TABS: 5.0000 mg | ORAL_TABLET | Freq: Every day | ORAL | 0 refills | Status: DC

## 2019-01-19 NOTE — Patient Instructions (Addendum)
Plan:  Will start low-dose anti-anxiety medication called Lexapro  Will follow-up in the office in 2 weeks or so: anxiety and recheck BP  Can get home blood pressure monitor and keep track of numbers. Will consider blood pressure medication based on how the numbers are looking  Bring your blood pressure monitor to the office next visit!   Will also consider starting medication for prostate enlargement which may be contributing to increased nighttime urination. No drinks, especially caffeine or alcohol, in the few hours prior to bedtime!         General Preventive Care  Most recent routine screening lipids/other labs: already done!   Blood pressure: goal 130 top number, 80 bottom number, or less   Tobacco: don't! .tob  Alcohol: responsible moderation is ok for most adults - if you have concerns about your alcohol intake, please talk to me!   Exercise: as tolerated to reduce risk of cardiovascular disease and diabetes. Strength training will also prevent osteoporosis.   Mental health: if need for mental health care (medicines, counseling, other), or concerns about moods, please let me know!   Sexual health: if need for STD testing, or if concerns with libido/pain problems, please let me know!  Advanced Directive: Living Will and/or Healthcare Power of Attorney recommended for all adults, regardless of age or health.  Vaccines  Flu vaccine: recommended for almost everyone, every fall.   Shingles vaccine: Shingrix recommended after age 46.   Pneumonia vaccines: Prevnar and Pneumovax recommended after age 38, or sooner if certain medical conditions/smokers.   Tetanus booster: Tdap recommended every 10 years.  Cancer screenings   Colon cancer screening: recommended for everyone at age 25  Prostate cancer screening: PSA blood test around age 63  Lung cancer screening: CT chest every year for those sge 65 to 49 years old with ?30 pack year smoking history, who either  currently smoke or have quit within the past 15 years. Infection screenings . HIV, Gonorrhea/Chlamydia: screening as needed. . Hepatitis C: recommended for anyone born 1945-1965, not needed for you . TB: certain at-risk populations, or depending on work requirements and/or travel history Other . Bone Density Test: recommended for men at age 16, sooner depending on risk factors . Abdominal Aortic Aneurysm: screening with ultrasound recommended once for men age 54-75 who have ever smoked

## 2019-01-19 NOTE — Progress Notes (Signed)
HPI: Anthony Rocha is a 48 y.o. male who  has no past medical history on file.  he presents to [facility] today, 01/19/19,  for chief complaint of: Annual physical  Concern for elevated BP     Patient here for annual physical / wellness exam.  See preventive care reviewed as below.  Recent labs reviewed in detail with the patient.   Additional concerns today include:   BP has been high, was high at [employer] physical, he had to lie down and wait 30 mins before it was I nok range, this was in 08/2018  Reports increased urination at night, sometimes once or twice sometimes most every hour, doesn't feel like he's fully emptying.   Reports increased anxiety over the past few months, more irritable and on edge. No situational factors he can think of (this all started around last fall/winter prior to COVID concerns).       Past medical, surgical, social and family history reviewed:  Patient Active Problem List   Diagnosis Date Noted  . Non-nicotine vapor product user 07/20/2018  . COPD, severe (HCC) 02/18/2018  . Abnormal chest x-ray 02/18/2018   No past surgical history on file.  Social History   Tobacco Use  . Smoking status: Former Smoker    Packs/day: 2.00    Years: 30.00    Pack years: 60.00    Types: Cigarettes    Last attempt to quit: 01/07/2018    Years since quitting: 1.0  . Smokeless tobacco: Never Used  Substance Use Topics  . Alcohol use: Yes    Comment: 12 drinks/wk    No family history on file.   Current medication list and allergy/intolerance information reviewed:    Current Outpatient Medications  Medication Sig Dispense Refill  . Albuterol Sulfate (PROAIR RESPICLICK) 108 (90 Base) MCG/ACT AEPB Inhale 1-2 puffs into the lungs every 4 (four) hours as needed (wheeze/short of breath). (Patient not taking: Reported on 01/19/2019) 2 each 11  . escitalopram (LEXAPRO) 5 MG tablet Take 1 tablet (5 mg total) by mouth daily.  90 tablet 0   No current facility-administered medications for this visit.     No Known Allergies    Review of Systems:  Constitutional:  No  fever, no chills, No recent illness, No unintentional weight changes. No significant fatigue.   HEENT: No  headache, no vision change, no hearing change, No sore throat, No  sinus pressure  Cardiac: No  chest pain, No  pressure, No palpitations, No  Orthopnea  Respiratory:  No  shortness of breath. No  Cough  Gastrointestinal: No  abdominal pain, No  nausea, No  vomiting,  No  blood in stool, No  diarrhea, No  constipation   Musculoskeletal: No new myalgia/arthralgia  Skin: No  Rash, No other wounds/concerning lesions  Genitourinary: No  incontinence, No  abnormal genital bleeding, No abnormal genital discharge  Hem/Onc: No  easy bruising/bleeding, No  abnormal lymph node  Endocrine: No cold intolerance,  No heat intolerance. No polyuria/polydipsia/polyphagia   Neurologic: No  weakness, No  dizziness, No  slurred speech/focal weakness/facial droop  Psychiatric: No  concerns with depression, No  concerns with anxiety, No sleep problems, No mood problems   Exam:  151/112 on intake 147/83 on repeat  BP (!) 147/83 (BP Location: Left Arm, Patient Position: Sitting, Cuff Size: Large)   Pulse 89   Temp 98.6 F (37 C) (Oral)   Wt 210 lb (95.3 kg)   BMI  30.13 kg/m   BP Readings from Last 3 Encounters:  01/19/19 (!) 147/83  07/20/18 (!) 130/91  02/18/18 134/90    Constitutional: VS see above. General Appearance: alert, well-developed, well-nourished, NAD  Eyes: Normal lids and conjunctive, non-icteric sclera  Ears, Nose, Mouth, Throat: MMM, Normal external inspection ears/nares/mouth/lips/gums. TM normal bilaterally. Pharynx/tonsils no erythema, no exudate. Nasal mucosa normal.   Neck: No masses, trachea midline. No thyroid enlargement. No tenderness/mass appreciated. No lymphadenopathy  Respiratory: Normal respiratory effort.  no wheeze, no rhonchi, no rales  Cardiovascular: S1/S2 normal, no murmur, no rub/gallop auscultated. RRR. No lower extremity edema. Pedal pulse II/IV bilaterally DP and PT. No carotid bruit or JVD. No abdominal aortic bruit.  Gastrointestinal: Nontender, no masses. No hepatomegaly, no splenomegaly. No hernia appreciated. Bowel sounds normal. Rectal exam deferred.   Musculoskeletal: Gait normal. No clubbing/cyanosis of digits.   Neurological: Normal balance/coordination. No tremor. No cranial nerve deficit on limited exam. Motor and sensation intact and symmetric. Cerebellar reflexes intact.   Skin: warm, dry, intact. No rash/ulcer. No concerning nevi or subq nodules on limited exam.    Psychiatric: Normal judgment/insight. Normal mood and affect. Oriented x3.    No results found for this or any previous visit (from the past 72 hour(s)).  No results found.   ASSESSMENT/PLAN: The primary encounter diagnosis was Annual physical exam. Diagnoses of Elevated blood pressure reading, COPD, severe (HCC), Need for diphtheria-tetanus-pertussis (Tdap) vaccine, Anxiety, and Nocturia were also pertinent to this visit.   Orders Placed This Encounter  Procedures  . Tdap vaccine greater than or equal to 7yo IM   The 10-year ASCVD risk score Denman George DC Montez Hageman., et al., 2013) is: 3.7%   Values used to calculate the score:     Age: 64 years     Sex: Male     Is Non-Hispanic African American: No     Diabetic: No     Tobacco smoker: No     Systolic Blood Pressure: 147 mmHg     Is BP treated: No     HDL Cholesterol: 52 mg/dL     Total Cholesterol: 196 mg/dL   Meds ordered this encounter  Medications  . escitalopram (LEXAPRO) 5 MG tablet    Sig: Take 1 tablet (5 mg total) by mouth daily.    Dispense:  90 tablet    Refill:  0    Patient Instructions  Plan:  Will start low-dose anti-anxiety medication called Lexapro  Will follow-up in the office in 2 weeks or so: anxiety and recheck BP  Can  get home blood pressure monitor and keep track of numbers. Will consider blood pressure medication based on how the numbers are looking  Bring your blood pressure monitor to the office next visit!   Will also consider starting medication for prostate enlargement which may be contributing to increased nighttime urination. No drinks, especially caffeine or alcohol, in the few hours prior to bedtime!         General Preventive Care  Most recent routine screening lipids/other labs: already done!   Blood pressure: goal 130 top number, 80 bottom number, or less   Tobacco: don't! .tob  Alcohol: responsible moderation is ok for most adults - if you have concerns about your alcohol intake, please talk to me!   Exercise: as tolerated to reduce risk of cardiovascular disease and diabetes. Strength training will also prevent osteoporosis.   Mental health: if need for mental health care (medicines, counseling, other), or concerns about moods, please let  me know!   Sexual health: if need for STD testing, or if concerns with libido/pain problems, please let me know!  Advanced Directive: Living Will and/or Healthcare Power of Attorney recommended for all adults, regardless of age or health.  Vaccines  Flu vaccine: recommended for almost everyone, every fall.   Shingles vaccine: Shingrix recommended after age 50.   Pneumonia vaccines: Prevnar and Pneumovax recommended after age 50, or sooner if certain medical conditions/smokers.   Tetanus booster: Tdap recommended every 10 years.  Cancer screenings   Colon cancer screening: recommended for everyone at age 50  Prostate cancer screening: PSA blood test around age 50  Lung cancer screening: CT chest every year for those sge 7055 to 50 years old with ?30 pack year smoking history, who either currently smoke or have quit within the past 15 years. Infection screenings . HIV, Gonorrhea/Chlamydia: screening as needed. . Hepatitis C: recommended  for anyone born 1945-1965, not needed for you . TB: certain at-risk populations, or depending on work requirements and/or travel history Other . Bone Density Test: recommended for men at age 50, sooner depending on risk factors . Abdominal Aortic Aneurysm: screening with ultrasound recommended once for men age 50-75 who have ever smoked             Visit summary with medication list and pertinent instructions was printed for patient to review. All questions at time of visit were answered - patient instructed to contact office with any additional concerns or updates. ER/RTC precautions were reviewed with the patient.     Please note: voice recognition software was used to produce this document, and typos may escape review. Please contact Dr. Lyn HollingsheadAlexander for any needed clarifications.     Follow-up plan: Return in about 2 weeks (around 02/02/2019) for in office - recheck BP and anxiety bring home BP monitor and recod of BP measurements .

## 2019-02-02 ENCOUNTER — Encounter: Payer: Self-pay | Admitting: Osteopathic Medicine

## 2019-02-02 ENCOUNTER — Ambulatory Visit: Payer: BLUE CROSS/BLUE SHIELD | Admitting: Osteopathic Medicine

## 2019-02-02 MED ORDER — VARENICLINE TARTRATE 1 MG PO TABS: 1.0000 mg | ORAL_TABLET | Freq: Two times a day (BID) | ORAL | 3 refills | Status: DC

## 2019-02-02 MED ORDER — VARENICLINE TARTRATE 0.5 MG X 11 & 1 MG X 42 PO MISC: ORAL | 0 refills | Status: DC

## 2019-02-07 ENCOUNTER — Other Ambulatory Visit: Payer: Self-pay | Admitting: Osteopathic Medicine

## 2019-02-08 ENCOUNTER — Other Ambulatory Visit: Payer: Self-pay

## 2019-02-08 ENCOUNTER — Encounter: Payer: Self-pay | Admitting: Osteopathic Medicine

## 2019-02-08 MED ORDER — VARENICLINE TARTRATE 1 MG PO TABS: 1.0000 mg | ORAL_TABLET | Freq: Two times a day (BID) | ORAL | 3 refills | Status: DC

## 2019-02-08 MED ORDER — VARENICLINE TARTRATE 0.5 MG X 11 & 1 MG X 42 PO MISC: ORAL | 0 refills | Status: DC

## 2019-06-05 ENCOUNTER — Encounter: Payer: Self-pay | Admitting: Osteopathic Medicine

## 2019-06-06 ENCOUNTER — Other Ambulatory Visit: Payer: Self-pay | Admitting: Osteopathic Medicine

## 2019-06-06 MED ORDER — VARENICLINE TARTRATE 1 MG PO TABS: 1.0000 mg | ORAL_TABLET | Freq: Two times a day (BID) | ORAL | 3 refills | Status: DC

## 2019-08-25 IMAGING — CT CT CHEST W/O CM
2 of 3 series · 15 of 36 positions shown, 18 images · non-contrast
Comparison: Radiographs February 18, 2018.

CLINICAL DATA: Possible pulmonary nodules.

EXAM:
CT CHEST WITHOUT CONTRAST
TECHNIQUE: Multidetector CT imaging of the chest was performed following the
standard protocol without IV contrast.

[Series 2: thorax · axial · 0.83mm/px · z∈[-352,-52]mm · 12 of 72 slices shown, 15 images]
[im 6/72  mediastinal]
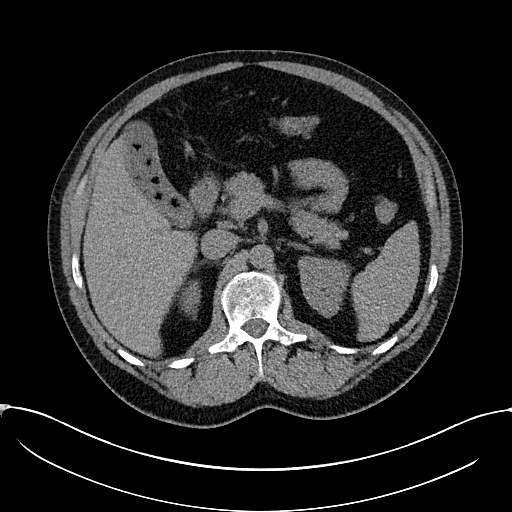
[im 6/72  lung]
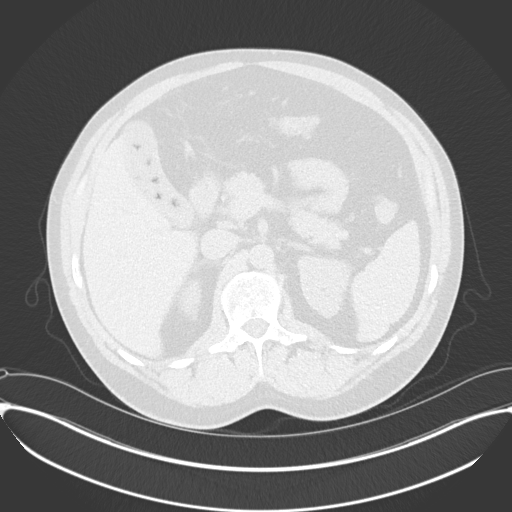
[im 11/72  lung]
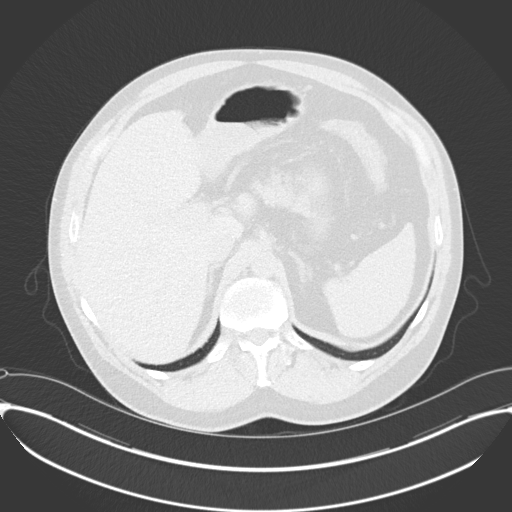
[im 16/72  lung]
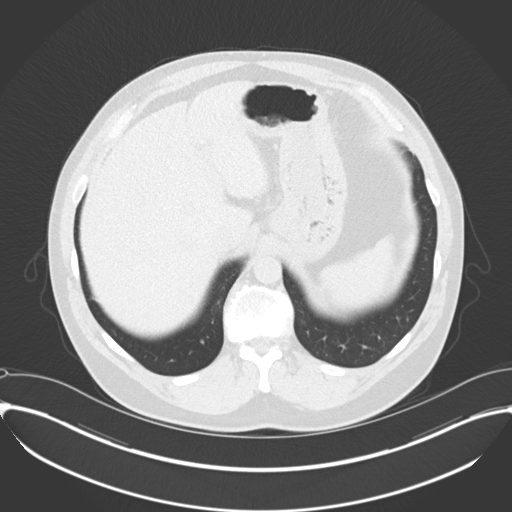
[im 22/72  lung]
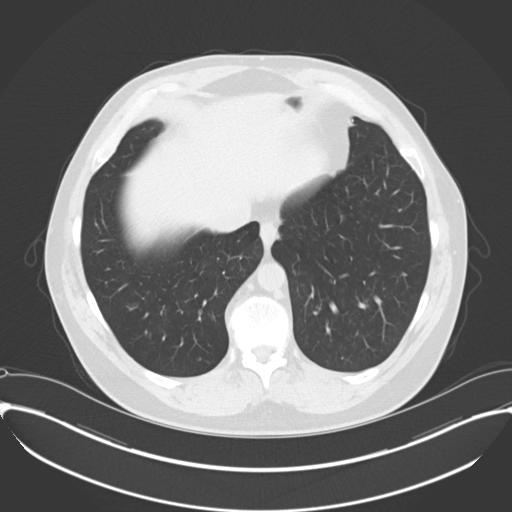
[im 27/72  mediastinal]
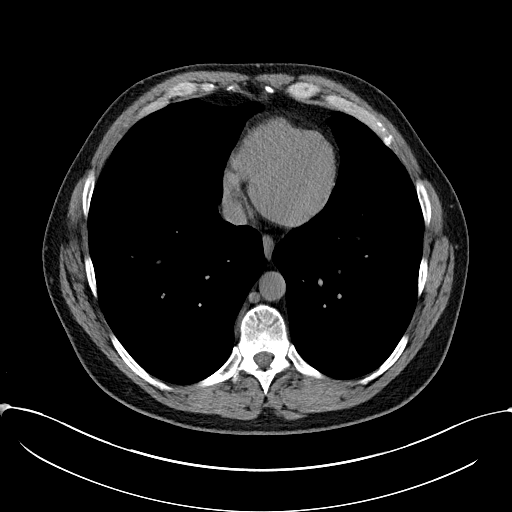
[im 27/72  lung]
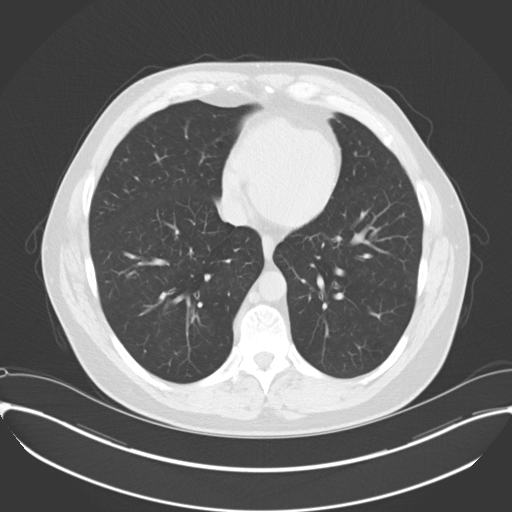
[im 32/72  lung]
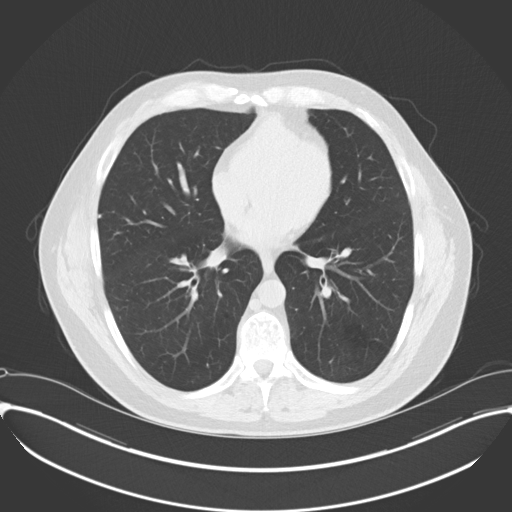
[im 40/72  lung]
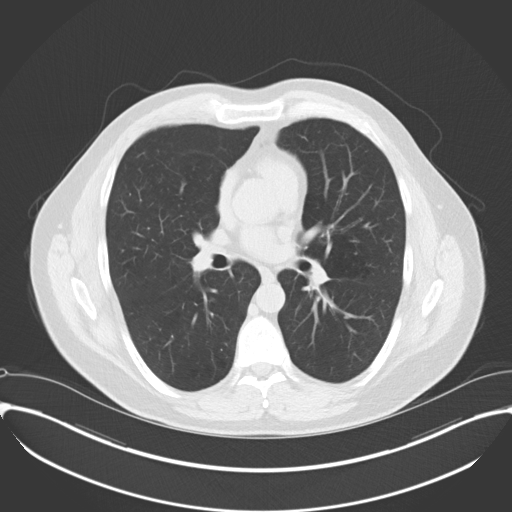
[im 45/72  lung]
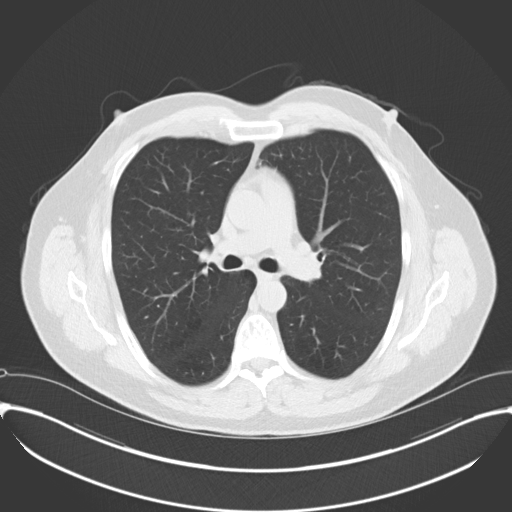
[im 50/72  mediastinal]
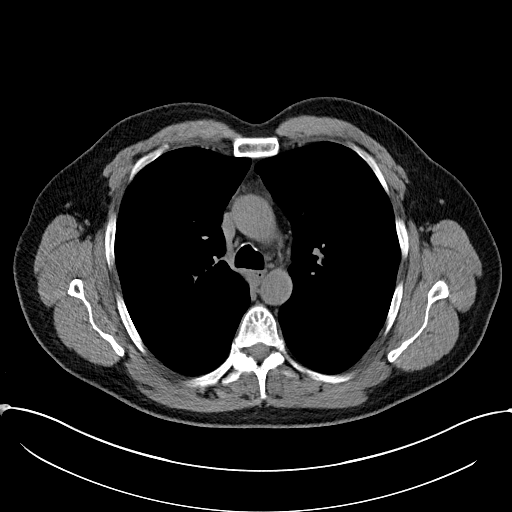
[im 50/72  lung]
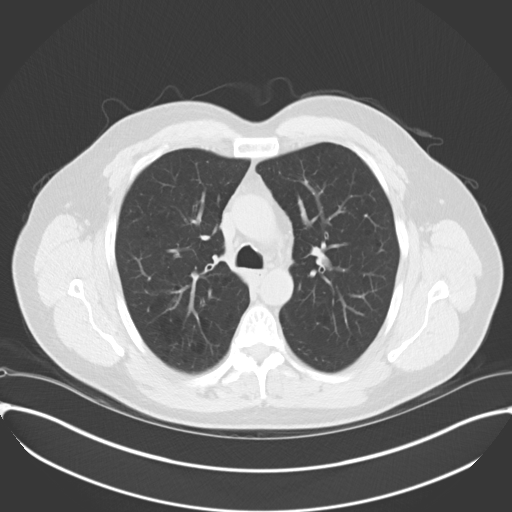
[im 56/72  lung]
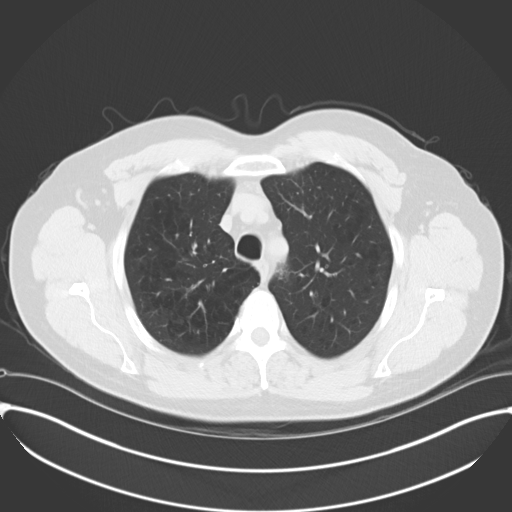
[im 61/72  lung]
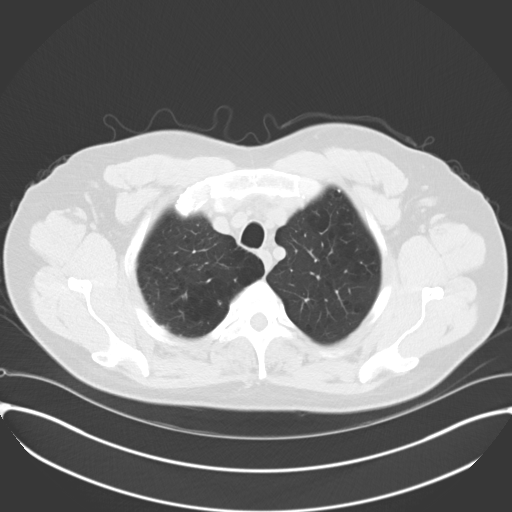
[im 66/72  lung]
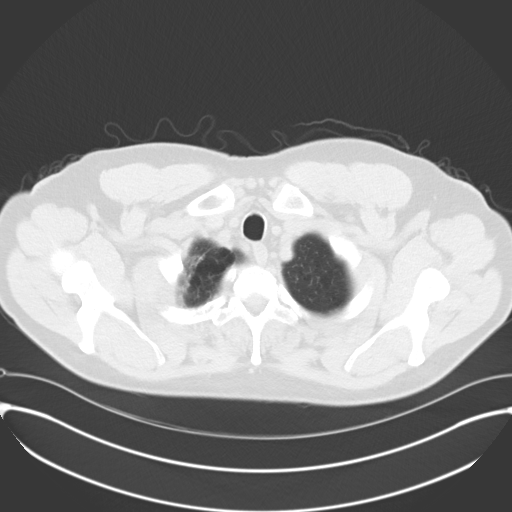

[Series 5: coronal · coronal · 0.71mm/px · 3 of 163 slices shown]
[im 33/163  lung]
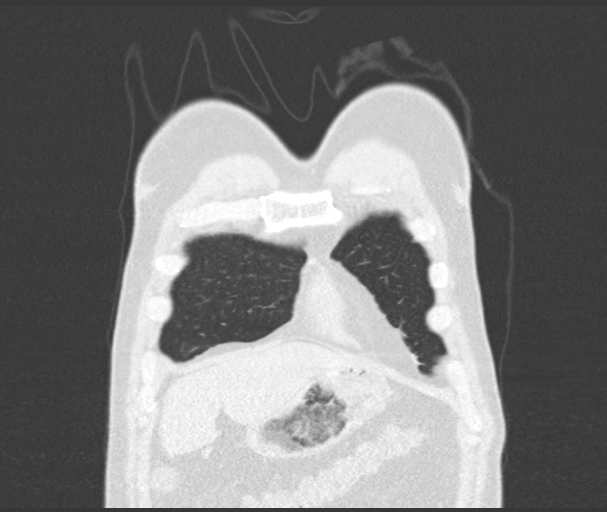
[im 65/163  lung]
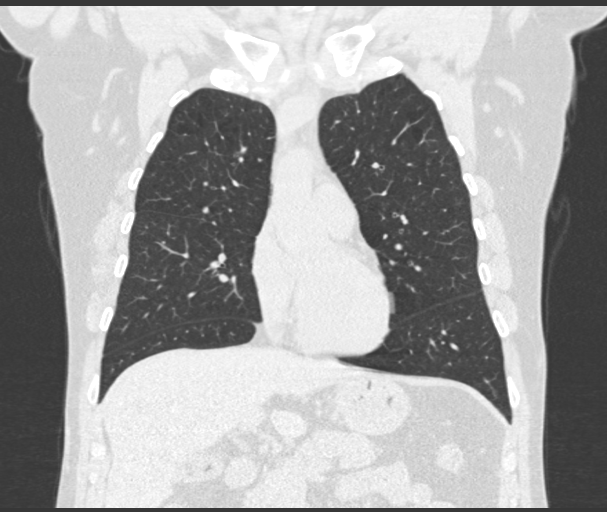
[im 98/163  lung]
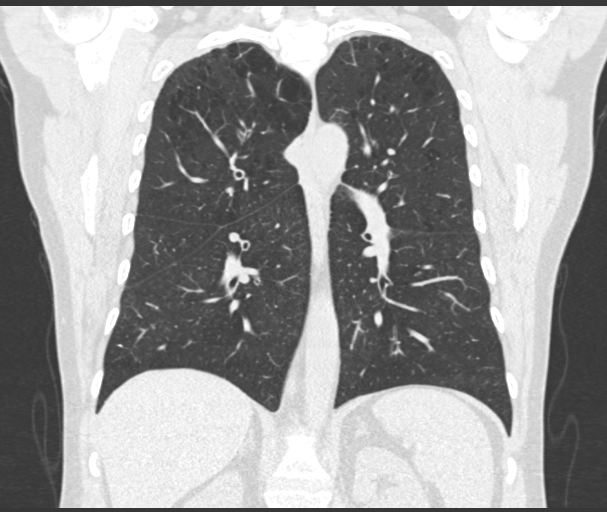

[15 of 36 positions shown; findings below may reference images not displayed]

FINDINGS: Cardiovascular: No significant vascular findings. Normal heart size.
No pericardial effusion.

Mediastinum/Nodes: No enlarged mediastinal or axillary lymph nodes.
Thyroid gland, trachea, and esophagus demonstrate no significant
findings.

Lungs/Pleura: No pneumothorax or pleural effusion is noted. Multiple
small calcified granulomata are noted bilaterally. Emphysematous
disease is noted in the upper lobes bilaterally.

Upper Abdomen: Cholelithiasis is noted without inflammation. No
other abnormality seen in the upper abdomen.

Musculoskeletal: No chest wall mass or suspicious bone lesions
identified.
IMPRESSION: Multiple small calcified granulomata are noted bilaterally
suggesting prior granulomatous disease.

No acute abnormality is seen in the chest.

Cholelithiasis without inflammation in visualized portion of upper
abdomen.

Emphysema ([id number]).

## 2019-10-18 IMAGING — DX DG CHEST 2V
2 series · 2 of 2 positions shown · non-contrast
Comparison: None.

CLINICAL DATA: Persistent cough for 1 month.  Current smoker.

EXAM:
CHEST - 2 VIEW

[chest pa]
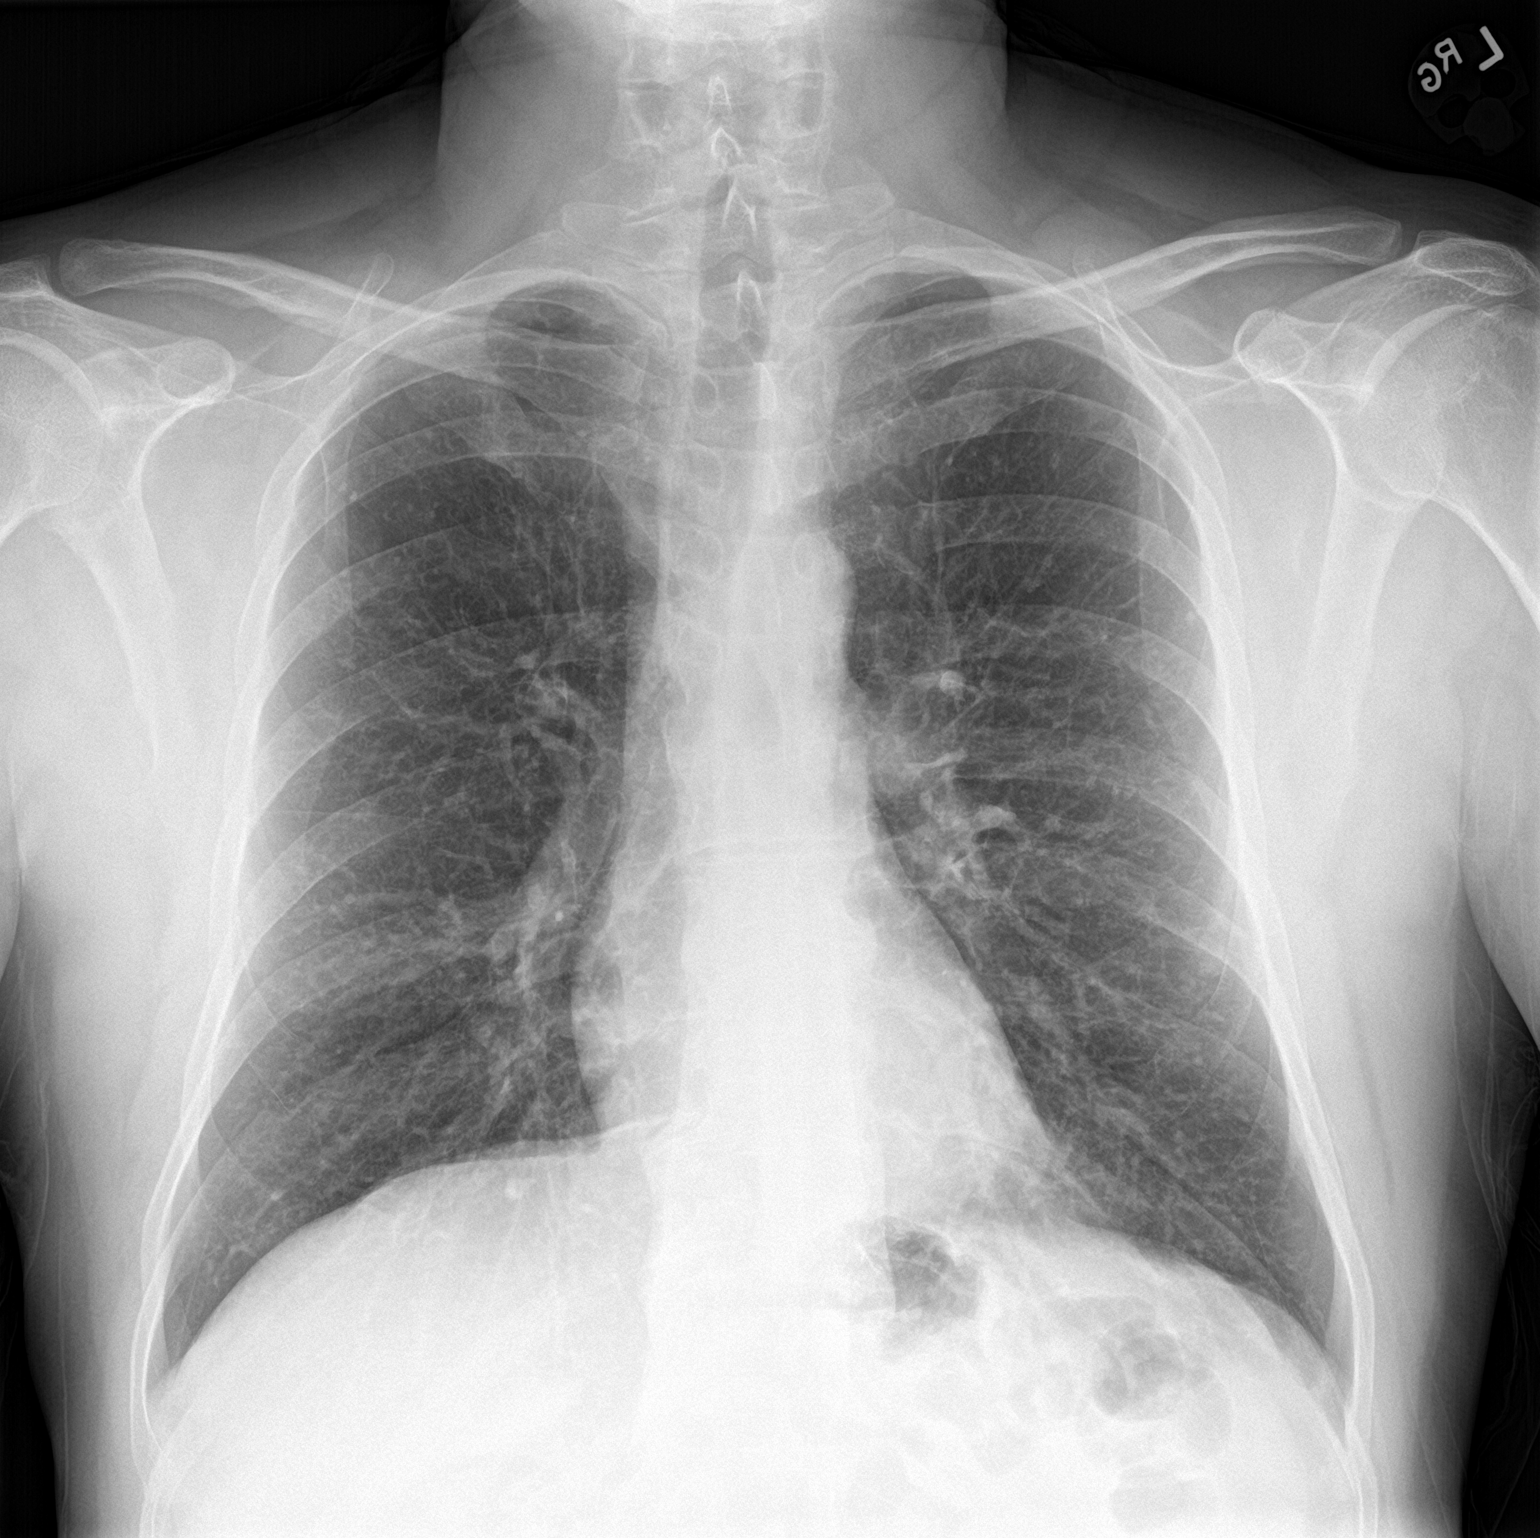

[chest lat]
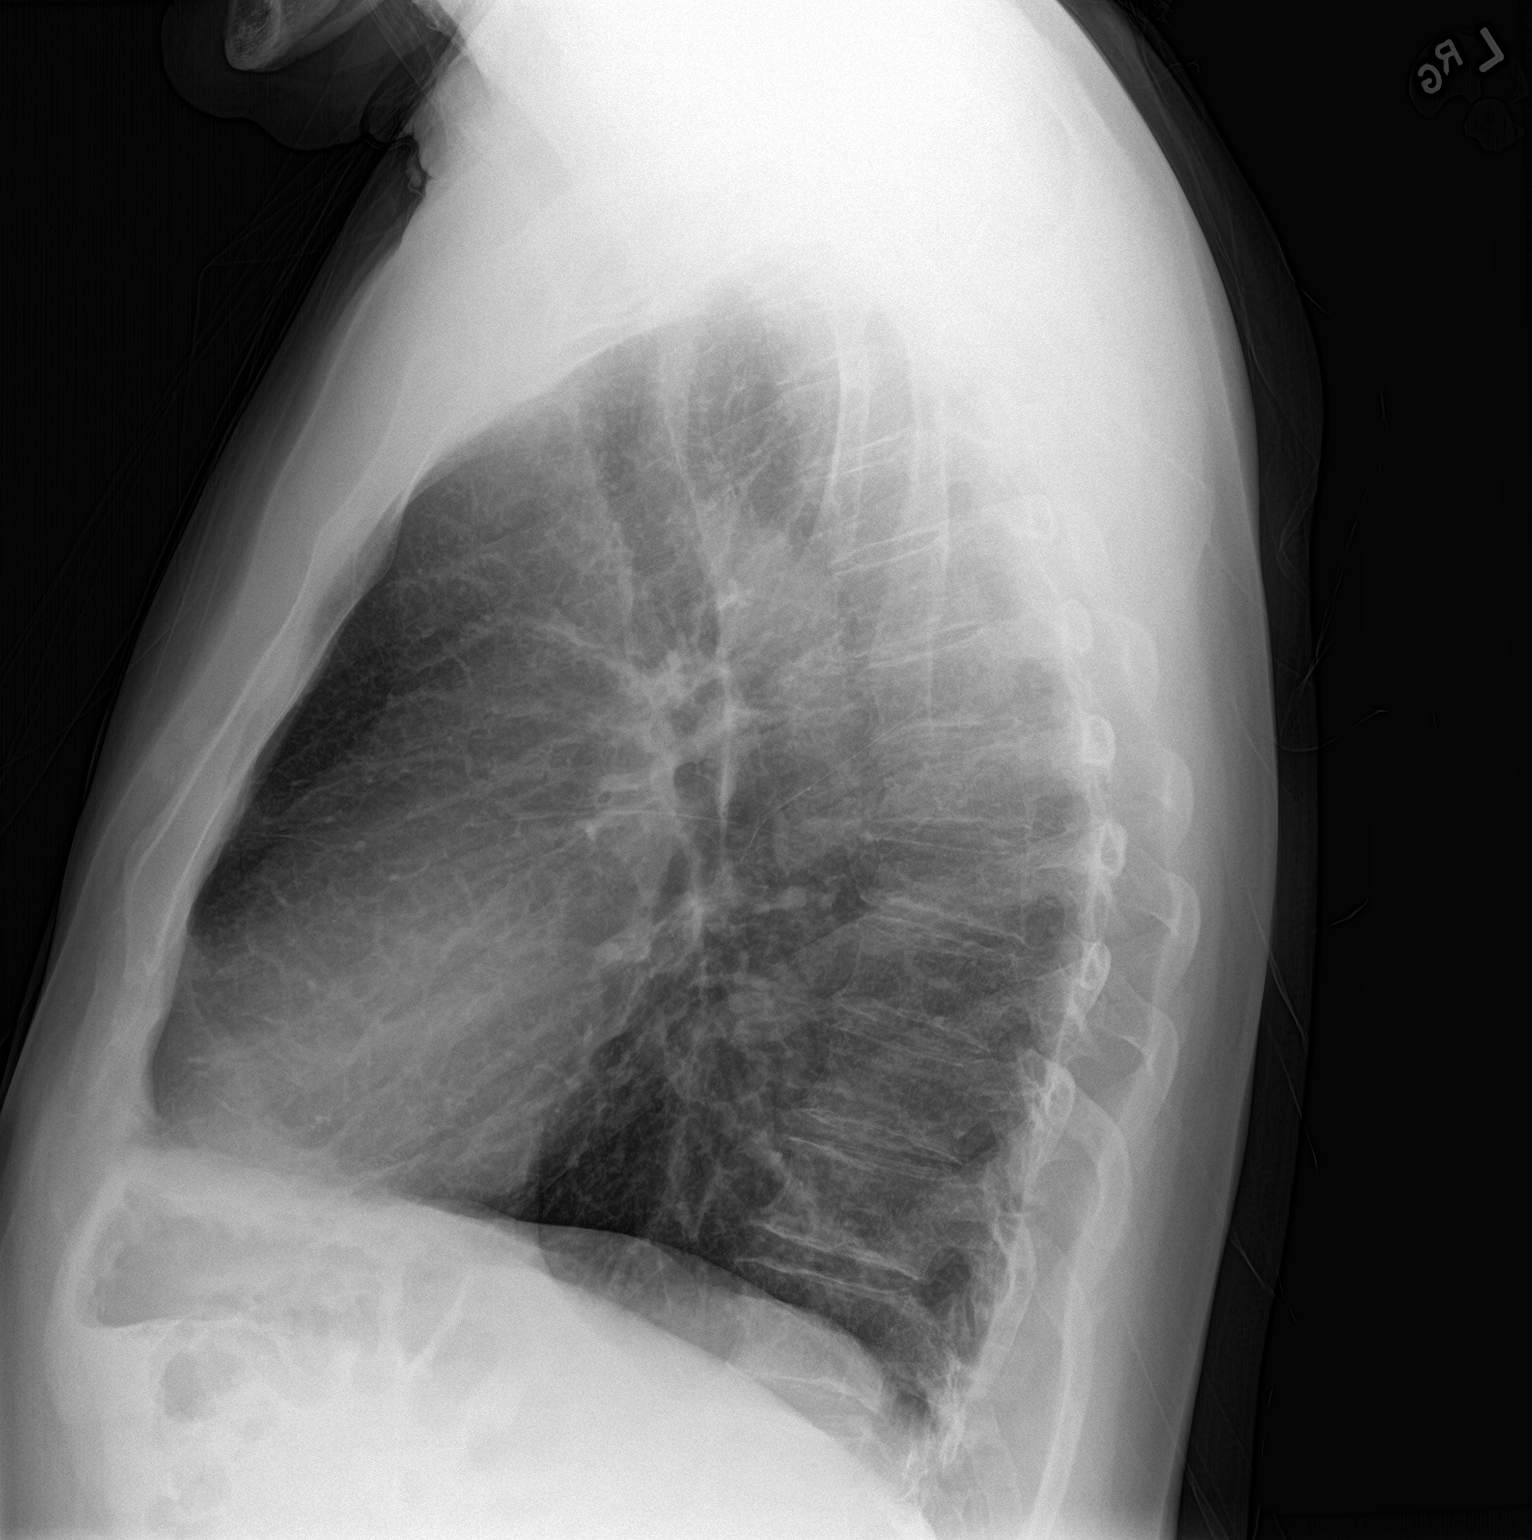

[2 of 2 positions shown; findings below may reference images not displayed]

FINDINGS: Normal heart size. Normal mediastinal contour. No pneumothorax. No
pleural effusion. Mildly hyperinflated lungs. No pulmonary edema.
Tiny nodular densities throughout both lungs measuring up to 3 mm in
size. No acute consolidative airspace disease. No pulmonary edema.
IMPRESSION: 1. Nonspecific tiny nodular densities throughout both lungs
measuring up to 3 mm in size. While most likely to represent tiny
benign granulomas, nonspecific bronchiolitis cannot be excluded.
Recommend either short-term follow-up PA and lateral chest
radiographs in 2-3 months or further evaluation with chest CT, as
clinically warranted.
2. No acute consolidative airspace disease to suggest a pneumonia.
3. Mildly hyperinflated lungs, suggesting COPD.

## 2020-02-06 ENCOUNTER — Other Ambulatory Visit: Payer: Self-pay

## 2020-02-06 ENCOUNTER — Encounter: Payer: Self-pay | Admitting: Osteopathic Medicine

## 2020-02-06 ENCOUNTER — Telehealth (INDEPENDENT_AMBULATORY_CARE_PROVIDER_SITE_OTHER): Payer: BC Managed Care – PPO | Admitting: Osteopathic Medicine

## 2020-02-06 VITALS — Wt 220.0 lb

## 2020-02-06 DIAGNOSIS — J441 Chronic obstructive pulmonary disease with (acute) exacerbation: Secondary | ICD-10-CM | POA: Diagnosis not present

## 2020-02-06 DIAGNOSIS — J329 Chronic sinusitis, unspecified: Secondary | ICD-10-CM | POA: Diagnosis not present

## 2020-02-06 MED ORDER — AMOXICILLIN-POT CLAVULANATE 875-125 MG PO TABS: 1.0000 | ORAL_TABLET | Freq: Two times a day (BID) | ORAL | 0 refills | Status: AC

## 2020-02-06 MED ORDER — BUDESONIDE-FORMOTEROL FUMARATE 160-4.5 MCG/ACT IN AERO: 2.0000 | INHALATION_SPRAY | Freq: Two times a day (BID) | RESPIRATORY_TRACT | 3 refills | Status: DC

## 2020-02-06 MED ORDER — PREDNISONE 20 MG PO TABS: 20.0000 mg | ORAL_TABLET | Freq: Two times a day (BID) | ORAL | 0 refills | Status: DC

## 2020-02-06 MED ORDER — LORATADINE 10 MG PO TABS: 10.0000 mg | ORAL_TABLET | Freq: Every day | ORAL | 3 refills | Status: DC

## 2020-02-06 MED ORDER — ALBUTEROL SULFATE HFA 108 (90 BASE) MCG/ACT IN AERS: 1.0000 | INHALATION_SPRAY | Freq: Four times a day (QID) | RESPIRATORY_TRACT | 99 refills | Status: DC | PRN

## 2020-02-06 NOTE — Progress Notes (Signed)
Virtual Visit via Video (App used: Doximity) Note  I connected with      Anthony Rocha on 02/06/20 at 1:51 PM  by a telemedicine application and verified that I am speaking with the correct person using two identifiers.  Patient is at home  I am in office   I discussed the limitations of evaluation and management by telemedicine and the availability of in person appointments. The patient expressed understanding and agreed to proceed.  History of Present Illness: Anthony Rocha is a 49 y.o. male who would like to discuss sinus problem   Sinus congestion, ongoing maybe a week, OTC Rx not helpful.  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 Influenza,inj,Quad PF,6+ Mos 07/20/2018  . Influenza-Unspecified 08/21/2017  . Pneumococcal Polysaccharide-23 02/18/2018  . Tdap 01/19/2019     Observations/Objective: Wt 220 lb (99.8 kg)   BMI 31.57 kg/m  BP Readings from Last 3 Encounters:  01/19/19 (!) 147/83  07/20/18 (!) 130/91  02/18/18 134/90   Exam: Normal Speech.  NAD  Lab and Radiology Results No results found for this or any previous visit (from the past 72 hour(s)). No results found.     Assessment and Plan: 49 y.o. male with The primary encounter diagnosis was Chronic obstructive pulmonary disease with acute exacerbation (HCC). A diagnosis of Sinusitis, unspecified chronicity, unspecified location was also pertinent to this visit.   PDMP not reviewed this encounter. No orders of the defined types were placed in this encounter.  Meds ordered this encounter  Medications  . albuterol  (VENTOLIN HFA) 108 (90 Base) MCG/ACT inhaler    Sig: Inhale 1-2 puffs into the lungs every 6 (six) hours as needed for wheezing.    Dispense:  18 g    Refill:  99  . budesonide-formoterol (SYMBICORT) 160-4.5 MCG/ACT inhaler    Sig: Inhale 2 puffs into the lungs 2 (two) times daily.    Dispense:  1 Inhaler    Refill:  3  . predniSONE (DELTASONE) 20 MG tablet    Sig: Take 1 tablet (20 mg total) by mouth 2 (two) times daily with a meal.    Dispense:  10 tablet    Refill:  0  . amoxicillin-clavulanate (AUGMENTIN) 875-125 MG tablet    Sig: Take 1 tablet by mouth 2 (two) times daily for 10 days.    Dispense:  20 tablet    Refill:  0  . loratadine (CLARITIN) 10 MG tablet    Sig: Take 1 tablet (10 mg total) by mouth daily.    Dispense:  90 tablet    Refill:  3   Patient Instructions   Meds ordered this encounter  Medications  . albuterol (VENTOLIN HFA) 108 (90 Base) MCG/ACT inhaler    Sig: Inhale 1-2 puffs into the lungs every 6 (six) hours as needed for wheezing.    Dispense:  18 g    Refill:  99  . budesonide-formoterol (SYMBICORT) 160-4.5 MCG/ACT inhaler    Sig: Inhale 2 puffs into the lungs 2 (two) times daily.  Dispense:  1 Inhaler    Refill:  3  . predniSONE (DELTASONE) 20 MG tablet    Sig: Take 1 tablet (20 mg total) by mouth 2 (two) times daily with a meal.    Dispense:  10 tablet    Refill:  0  . amoxicillin-clavulanate (AUGMENTIN) 875-125 MG tablet    Sig: Take 1 tablet by mouth 2 (two) times daily for 10 days.    Dispense:  20 tablet    Refill:  0  . loratadine (CLARITIN) 10 MG tablet    Sig: Take 1 tablet (10 mg total) by mouth daily.    Dispense:  90 tablet    Refill:  3     Take al ALBUTEROL rescue inhaler as needed  Start the SYMBICORT inhaler for maintenance / prevention (let me know if this is really expensive)   Start the [place] allergy me  dication  Start the PREDNISONE steroids for COPD/allergy flare  If not better after 3-4 days, start the  AUGMENTIN antibiotics       Instructions sent via MyChart. If MyChart not available, pt was given option for info via personal e-mail w/ no guarantee of protected health info over unsecured e-mail communication, and MyChart sign-up instructions were sent to patient.   Follow Up Instructions: Return if symptoms worsen or fail to improve.    I discussed the assessment and treatment plan with the patient. The patient was provided an opportunity to ask questions and all were answered. The patient agreed with the plan and demonstrated an understanding of the instructions.   The patient was advised to call back or seek an in-person evaluation if any new concerns, if symptoms worsen or if the condition fails to improve as anticipated.  30 minutes of non-face-to-face time was provided during this encounter.      . . . . . . . . . . . . . Marland Kitchen                   Historical information moved to improve visibility of documentation.  No past medical history on file. No past surgical history on file. Social History   Tobacco Use  . Smoking status: Former Smoker    Packs/day: 2.00    Years: 30.00    Pack years: 60.00    Types: Cigarettes    Quit date: 01/07/2018    Years since quitting: 2.0  . Smokeless tobacco: Never Used  Substance Use Topics  . Alcohol use: Yes    Comment: 12 drinks/wk   family history is not on file.  Medications: Current Outpatient Medications  Medication Sig Dispense Refill  . escitalopram (LEXAPRO) 5 MG tablet Take 1 tablet (5 mg total) by mouth daily. 90 tablet 0  . varenicline (CHANTIX) 1 MG tablet Take 1 tablet (1 mg total) by mouth 2 (two) times daily. 60 tablet 3  . albuterol (VENTOLIN HFA) 108 (90 Base) MCG/ACT inhaler Inhale 1-2 puffs into the lungs every 6 (six) hours as needed for wheezing. 18 g 99  . amoxicillin-clavulanate (AUGMENTIN) 875-125 MG tablet Take 1 tablet by mouth 2 (two) times daily for 10 days. 20 tablet 0  .  budesonide-formoterol (SYMBICORT) 160-4.5 MCG/ACT inhaler Inhale 2 puffs into the lungs 2 (two) times daily. 1 Inhaler 3  . loratadine (CLARITIN) 10 MG tablet Take 1 tablet (10 mg total) by mouth daily. 90 tablet 3  . predniSONE (DELTASONE) 20 MG tablet Take 1 tablet (20 mg total) by mouth 2 (two)  times daily with a meal. 10 tablet 0   No current facility-administered medications for this visit.   No Known Allergies

## 2020-02-06 NOTE — Patient Instructions (Signed)
Meds ordered this encounter  Medications  . albuterol (VENTOLIN HFA) 108 (90 Base) MCG/ACT inhaler    Sig: Inhale 1-2 puffs into the lungs every 6 (six) hours as needed for wheezing.    Dispense:  18 g    Refill:  99  . budesonide-formoterol (SYMBICORT) 160-4.5 MCG/ACT inhaler    Sig: Inhale 2 puffs into the lungs 2 (two) times daily.    Dispense:  1 Inhaler    Refill:  3  . predniSONE (DELTASONE) 20 MG tablet    Sig: Take 1 tablet (20 mg total) by mouth 2 (two) times daily with a meal.    Dispense:  10 tablet    Refill:  0  . amoxicillin-clavulanate (AUGMENTIN) 875-125 MG tablet    Sig: Take 1 tablet by mouth 2 (two) times daily for 10 days.    Dispense:  20 tablet    Refill:  0  . loratadine (CLARITIN) 10 MG tablet    Sig: Take 1 tablet (10 mg total) by mouth daily.    Dispense:  90 tablet    Refill:  3     Take al ALBUTEROL rescue inhaler as needed  Start the SYMBICORT inhaler for maintenance / prevention (let me know if this is really expensive)   Start the CLARITIN allergy me  dication  Start the PREDNISONE steroids for COPD/allergy flare  If not better after 3-4 days, start the AUGMENTIN antibiotics

## 2020-02-20 ENCOUNTER — Telehealth: Payer: BLUE CROSS/BLUE SHIELD | Admitting: Osteopathic Medicine

## 2020-05-14 ENCOUNTER — Other Ambulatory Visit: Payer: Self-pay

## 2020-05-14 ENCOUNTER — Encounter: Payer: Self-pay | Admitting: Osteopathic Medicine

## 2020-05-14 ENCOUNTER — Ambulatory Visit (INDEPENDENT_AMBULATORY_CARE_PROVIDER_SITE_OTHER): Payer: BC Managed Care – PPO | Admitting: Osteopathic Medicine

## 2020-05-14 VITALS — BP 136/87 | HR 91 | Temp 98.2°F | Wt 215.0 lb

## 2020-05-14 DIAGNOSIS — F17200 Nicotine dependence, unspecified, uncomplicated: Secondary | ICD-10-CM

## 2020-05-14 DIAGNOSIS — L989 Disorder of the skin and subcutaneous tissue, unspecified: Secondary | ICD-10-CM | POA: Diagnosis not present

## 2020-05-14 MED ORDER — CHANTIX STARTING MONTH PAK 0.5 MG X 11 & 1 MG X 42 PO TABS: ORAL_TABLET | ORAL | 0 refills | Status: DC

## 2020-05-14 MED ORDER — CEPHALEXIN 500 MG PO CAPS: 500.0000 mg | ORAL_CAPSULE | Freq: Two times a day (BID) | ORAL | 0 refills | Status: DC

## 2020-05-14 MED ORDER — VARENICLINE TARTRATE 1 MG PO TABS: 1.0000 mg | ORAL_TABLET | Freq: Two times a day (BID) | ORAL | 1 refills | Status: DC

## 2020-05-14 NOTE — Progress Notes (Signed)
Anthony Rocha is a 49 y.o. male who presents to  [facility] at [facility]  today, 05/14/20, seeking care for the following:  . Lump on groin. Present whole life (describes as birth mark), concerned because it seems to be growing/raised and irritated. On exam, fleshy raised skin tag type lesion on R inner thigh proximal, mild erythema, no fluctuance noted, no ulceration      ASSESSMENT & PLAN with other pertinent findings:  The primary encounter diagnosis was Skin lesion. A diagnosis of Smoking was also pertinent to this visit.   Advised removal for definitive diagnosis, pt declined for now, I think reasonable to monitor given chronicity, location makes sense for irritation, lesions is symmetrical, no hyperpigmentation, smooth borders, small diameter.   Patient Instructions  Skin problem   Lesion appears benign (non-cancerous) but irritated   Will try week of antibiotics  Pads given to keep this covered  If better, nothing to do   If not better, or if removal/definitive diagnosis desired, please call  We can schedule removal with shave biopsy and send the specimen to the lab   Again, this looks non-cancerous but I cannot say for sure without removing it and sending it for analysis . Let me know!   Tobacco use   Chantix refilled       No orders of the defined types were placed in this encounter.   Meds ordered this encounter  Medications  . cephALEXin (KEFLEX) 500 MG capsule    Sig: Take 1 capsule (500 mg total) by mouth 2 (two) times daily.    Dispense:  14 capsule    Refill:  0  . varenicline (CHANTIX CONTINUING MONTH PAK) 1 MG tablet    Sig: Take 1 tablet (1 mg total) by mouth 2 (two) times daily.    Dispense:  60 tablet    Refill:  1  . varenicline (CHANTIX STARTING MONTH PAK) 0.5 MG X 11 & 1 MG X 42 tablet    Sig: Take one 0.5 mg tablet by mouth once daily for 3 days, then increase to one 0.5 mg tablet twice  daily for 4 days, then increase to one 1 mg tablet twice daily.    Dispense:  53 tablet    Refill:  0       Follow-up instructions: Return if symptoms worsen or fail to improve.                                         BP 136/87   Pulse 91   Temp 98.2 F (36.8 C) (Oral)   Wt 215 lb (97.5 kg)   SpO2 100%   BMI 30.85 kg/m   No outpatient medications have been marked as taking for the 05/14/20 encounter (Office Visit) with Sunnie Nielsen, DO.    No results found for this or any previous visit (from the past 72 hour(s)).  No results found.     All questions at time of visit were answered - patient instructed to contact office with any additional concerns or updates.  ER/RTC precautions were reviewed with the patient as applicable.   Please note: voice recognition software was used to produce this document, and typos may escape review. Please contact Dr. Lyn Hollingshead for any needed clarifications.

## 2020-05-14 NOTE — Patient Instructions (Addendum)
Skin problem   Lesion appears benign (non-cancerous) but irritated   Will try week of antibiotics  Pads given to keep this covered  If better, nothing to do   If not better, or if removal/definitive diagnosis desired, please call  We can schedule removal with shave biopsy and send the specimen to the lab   Again, this looks non-cancerous but I cannot say for sure without removing it and sending it for analysis . Let me know!   Tobacco use   Chantix refilled

## 2020-06-17 ENCOUNTER — Telehealth: Payer: Self-pay

## 2020-06-17 ENCOUNTER — Telehealth: Payer: Self-pay | Admitting: Osteopathic Medicine

## 2020-07-02 ENCOUNTER — Other Ambulatory Visit: Payer: Self-pay | Admitting: Osteopathic Medicine

## 2020-07-02 ENCOUNTER — Encounter: Payer: Self-pay | Admitting: Osteopathic Medicine

## 2020-07-02 DIAGNOSIS — Z1211 Encounter for screening for malignant neoplasm of colon: Secondary | ICD-10-CM

## 2020-07-04 ENCOUNTER — Other Ambulatory Visit: Payer: Self-pay | Admitting: Osteopathic Medicine

## 2020-07-16 ENCOUNTER — Other Ambulatory Visit: Payer: Self-pay | Admitting: Osteopathic Medicine

## 2020-07-17 ENCOUNTER — Other Ambulatory Visit: Payer: Self-pay | Admitting: Osteopathic Medicine

## 2020-07-17 NOTE — Telephone Encounter (Signed)
Pt/wife called in regards to med refills for symbicort. Per pt, he is using this medication everyday. Rx not listed in active med list. Pls advise, thanks.

## 2020-08-26 ENCOUNTER — Encounter: Payer: BC Managed Care – PPO | Admitting: Gastroenterology

## 2020-10-02 ENCOUNTER — Encounter: Payer: Self-pay | Admitting: Osteopathic Medicine

## 2020-10-02 ENCOUNTER — Telehealth (INDEPENDENT_AMBULATORY_CARE_PROVIDER_SITE_OTHER): Payer: BC Managed Care – PPO

## 2020-10-02 DIAGNOSIS — U071 COVID-19: Secondary | ICD-10-CM

## 2020-10-02 MED ORDER — BENZONATATE 200 MG PO CAPS: 200.0000 mg | ORAL_CAPSULE | Freq: Three times a day (TID) | ORAL | 0 refills | Status: DC | PRN

## 2020-10-02 NOTE — Telephone Encounter (Signed)
Pt's partner called stating that patient tested positive for Covid yesterday. Per partner, patient is experiencing excessive coughing and is feeling horrible. Requesting if provider can send in a rx for coughing? Pt has a dx of COPD. Pls advise, thanks.

## 2020-10-02 NOTE — Telephone Encounter (Signed)
Sent Rx and patient message  5 mins spent billed

## 2021-05-21 DIAGNOSIS — M25561 Pain in right knee: Secondary | ICD-10-CM | POA: Diagnosis not present

## 2021-05-21 DIAGNOSIS — S83241A Other tear of medial meniscus, current injury, right knee, initial encounter: Secondary | ICD-10-CM | POA: Diagnosis not present

## 2021-07-03 ENCOUNTER — Encounter: Payer: Self-pay | Admitting: Family Medicine

## 2021-07-03 ENCOUNTER — Other Ambulatory Visit: Payer: Self-pay

## 2021-07-03 ENCOUNTER — Ambulatory Visit: Payer: BC Managed Care – PPO | Admitting: Family Medicine

## 2021-07-03 VITALS — BP 156/105 | HR 99 | Temp 97.4°F | Ht 70.0 in | Wt 218.2 lb

## 2021-07-03 DIAGNOSIS — Z1211 Encounter for screening for malignant neoplasm of colon: Secondary | ICD-10-CM | POA: Diagnosis not present

## 2021-07-03 DIAGNOSIS — F17210 Nicotine dependence, cigarettes, uncomplicated: Secondary | ICD-10-CM | POA: Diagnosis not present

## 2021-07-03 DIAGNOSIS — R351 Nocturia: Secondary | ICD-10-CM | POA: Diagnosis not present

## 2021-07-03 DIAGNOSIS — J449 Chronic obstructive pulmonary disease, unspecified: Secondary | ICD-10-CM

## 2021-07-03 DIAGNOSIS — F17200 Nicotine dependence, unspecified, uncomplicated: Secondary | ICD-10-CM | POA: Insufficient documentation

## 2021-07-03 DIAGNOSIS — R030 Elevated blood-pressure reading, without diagnosis of hypertension: Secondary | ICD-10-CM

## 2021-07-03 MED ORDER — ALBUTEROL SULFATE HFA 108 (90 BASE) MCG/ACT IN AERS: 2.0000 | INHALATION_SPRAY | Freq: Four times a day (QID) | RESPIRATORY_TRACT | 6 refills | Status: AC | PRN

## 2021-07-03 MED ORDER — ALBUTEROL SULFATE HFA 108 (90 BASE) MCG/ACT IN AERS: 2.0000 | INHALATION_SPRAY | Freq: Four times a day (QID) | RESPIRATORY_TRACT | 6 refills | Status: DC | PRN

## 2021-07-03 MED ORDER — VARENICLINE TARTRATE 0.5 MG X 11 & 1 MG X 42 PO MISC: ORAL | 0 refills | Status: DC

## 2021-07-03 MED ORDER — VARENICLINE TARTRATE 1 MG PO TABS
1.0000 mg | ORAL_TABLET | Freq: Two times a day (BID) | ORAL | 1 refills | Status: DC
Start: 2021-07-03 — End: 2021-07-03

## 2021-07-03 MED ORDER — VARENICLINE TARTRATE 1 MG PO TABS: 1.0000 mg | ORAL_TABLET | Freq: Two times a day (BID) | ORAL | 1 refills | Status: DC

## 2021-07-03 NOTE — Progress Notes (Signed)
Anthony Rocha - 50 y.o. male MRN [id number]  Date of birth: 28-May-1971  Subjective Chief Complaint  Patient presents with   Nicotine Dependence   Breathing Problem    HPI Anthony Rocha is a 50 year old male here today for initial visit with me.  He is a former patient of Dr. Lyn Hollingshead.  He has a history of COPD.  He had quit smoking previously however picked this back up recently.  He is interested in quitting again.  He had good success with Chantix in the past.  He also feels that Symbicort is not effective.  He is using this on a as needed basis is more of a rescue inhaler.  Blood pressure is elevated in clinic today.  He reports he does check blood pressure at home and readings have been in the 120s to 130s over 70s to 80s.  He denies chest pain, headaches or vision changes.  He would like to have updated labs today.  He is also concerned about urinary frequency.  He reports he has discussed this with Dr. Lyn Hollingshead in the past and was told not to worry about it.  He reports getting up several times at night.  Denies difficulty starting stream or urinary pain.  He also would like to have referral for colonoscopy.  ROS:  A comprehensive ROS was completed and negative except as noted per HPI  No Known Allergies  Past Medical History:  Diagnosis Date   COPD (chronic obstructive pulmonary disease) (HCC)    Emphysema lung (HCC)     History reviewed. No pertinent surgical history.  Social History   Socioeconomic History   Marital status: Single    Spouse name: Not on file   Number of children: Not on file   Years of education: Not on file   Highest education level: Not on file  Occupational History   Occupation: DRIVER    Employer: [employer]  Tobacco Use   Smoking status: Every Day    Packs/day: 0.50    Years: 1.00    Pack years: 0.50    Types: Cigarettes    Last attempt to quit: 01/07/2018    Years since quitting: 3.4   Smokeless tobacco: Never   Tobacco  comments:    Pt previously smoked for 30 years and quit in 2019. Restarted in 2022.  Vaping Use   Vaping Use: Never used  Substance and Sexual Activity   Alcohol use: Yes    Comment: 12 drinks/wk   Drug use: Never   Sexual activity: Yes    Partners: Female    Birth control/protection: None  Other Topics Concern   Not on file  Social History Narrative   Not on file   Social Determinants of Health   Financial Resource Strain: Not on file  Food Insecurity: Not on file  Transportation Needs: Not on file  Physical Activity: Not on file  Stress: Not on file  Social Connections: Not on file    History reviewed. No pertinent family history.  Health Maintenance  Topic Date Due   HIV Screening  Never done   Hepatitis C Screening  Never done   Zoster Vaccines- Shingrix (1 of 2) 10/03/2021 (Originally 07/14/2019)   COVID-19 Vaccine (1) 07/19/2022 (Originally 01/11/1972)   TETANUS/TDAP  01/18/2029   INFLUENZA VACCINE  Completed   HPV VACCINES  Aged Out   COLONOSCOPY (Pts 45-46yrs Insurance coverage will need to be confirmed)  Discontinued     ----------------------------------------------------------------------------------------------------------------------------------------------------------------------------------------------------------------- Physical Exam BP (!) 156/105  Pulse 99   Temp (!) 97.4 F (36.3 C)   Ht 5\' 10"  (1.778 m)   Wt 218 lb 3.2 oz (99 kg)   SpO2 98%   BMI 31.31 kg/m   Physical Exam Constitutional:      Appearance: Normal appearance.  HENT:     Head: Normocephalic and atraumatic.  Eyes:     General: No scleral icterus. Cardiovascular:     Rate and Rhythm: Normal rate and regular rhythm.  Pulmonary:     Effort: Pulmonary effort is normal.     Breath sounds: Normal breath sounds.  Neurological:     Mental Status: He is alert.  Psychiatric:        Mood and Affect: Mood normal.        Behavior: Behavior normal.     ------------------------------------------------------------------------------------------------------------------------------------------------------------------------------------------------------------------- Assessment and Plan  COPD, severe (HCC) Discussed that Symbicort is to be used daily and not as an as needed medication.  Adding albuterol as needed.  If he continues to experience increased symptoms we can consider change to different inhaler such as Trelegy.  Nicotine dependence Counseled on smoking cessation.  Adding Chantix back on.  Discussed setting quit date.  Elevated blood pressure reading Blood pressure elevated in clinic.  Reports normal blood pressure readings at home.  Asked him to keep a log of his readings from home.  Nocturia Checking urinalysis and PSA.   Meds ordered this encounter  Medications   DISCONTD: albuterol (VENTOLIN HFA) 108 (90 Base) MCG/ACT inhaler    Sig: Inhale 2 puffs into the lungs every 6 (six) hours as needed for wheezing or shortness of breath.    Dispense:  8 g    Refill:  6   DISCONTD: varenicline (CHANTIX STARTING MONTH PAK) 0.5 MG X 11 & 1 MG X 42 tablet    Sig: Take one 0.5 mg tablet by mouth once daily for 3 days, then increase to one 0.5 mg tablet twice daily for 4 days, then increase to one 1 mg tablet twice daily.    Dispense:  53 tablet    Refill:  0   DISCONTD: varenicline (CHANTIX CONTINUING MONTH PAK) 1 MG tablet    Sig: Take 1 tablet (1 mg total) by mouth 2 (two) times daily.    Dispense:  60 tablet    Refill:  1   albuterol (VENTOLIN HFA) 108 (90 Base) MCG/ACT inhaler    Sig: Inhale 2 puffs into the lungs every 6 (six) hours as needed for wheezing or shortness of breath.    Dispense:  8 g    Refill:  6   varenicline (CHANTIX CONTINUING MONTH PAK) 1 MG tablet    Sig: Take 1 tablet (1 mg total) by mouth 2 (two) times daily.    Dispense:  60 tablet    Refill:  1   varenicline (CHANTIX STARTING MONTH PAK) 0.5 MG X 11  & 1 MG X 42 tablet    Sig: Take one 0.5 mg tablet by mouth once daily for 3 days, then increase to one 0.5 mg tablet twice daily for 4 days, then increase to one 1 mg tablet twice daily.    Dispense:  53 tablet    Refill:  0    Return in about 3 months (around 10/03/2021) for smoking cessation.    This visit occurred during the SARS-CoV-2 public health emergency.  Safety protocols were in place, including screening questions prior to the visit, additional usage of staff PPE, and extensive cleaning of  exam room while observing appropriate contact time as indicated for disinfecting solutions.

## 2021-07-03 NOTE — Assessment & Plan Note (Signed)
Discussed that Symbicort is to be used daily and not as an as needed medication.  Adding albuterol as needed.  If he continues to experience increased symptoms we can consider change to different inhaler such as Trelegy.

## 2021-07-03 NOTE — Patient Instructions (Signed)
Nice to meet you today! Start chantix to help with quitting smoking.  Continue symbicort as directed.  Try albuterol as needed for shortness of breath or wheezing.  We'll be in touch with labs and recommendations

## 2021-07-03 NOTE — Assessment & Plan Note (Signed)
Checking urinalysis and PSA.

## 2021-07-03 NOTE — Assessment & Plan Note (Signed)
Counseled on smoking cessation.  Adding Chantix back on.  Discussed setting quit date.

## 2021-07-03 NOTE — Assessment & Plan Note (Signed)
Blood pressure elevated in clinic.  Reports normal blood pressure readings at home.  Asked him to keep a log of his readings from home.

## 2021-07-04 LAB — COMPLETE METABOLIC PANEL WITH GFR
AG Ratio: 1.6 (calc) (ref 1.0–2.5)
ALT: 21 U/L (ref 9–46)
AST: 15 U/L (ref 10–35)
Albumin: 4.1 g/dL (ref 3.6–5.1)
Alkaline phosphatase (APISO): 71 U/L (ref 35–144)
BUN: 14 mg/dL (ref 7–25)
CO2: 27 mmol/L (ref 20–32)
Calcium: 8.9 mg/dL (ref 8.6–10.3)
Chloride: 105 mmol/L (ref 98–110)
Creat: 1.06 mg/dL (ref 0.70–1.30)
Globulin: 2.6 g/dL (calc) (ref 1.9–3.7)
Glucose, Bld: 97 mg/dL (ref 65–99)
Potassium: 5 mmol/L (ref 3.5–5.3)
Sodium: 140 mmol/L (ref 135–146)
Total Bilirubin: 0.4 mg/dL (ref 0.2–1.2)
Total Protein: 6.7 g/dL (ref 6.1–8.1)
eGFR: 85 mL/min/{1.73_m2} (ref >=60)

## 2021-07-04 LAB — URINALYSIS, ROUTINE W REFLEX MICROSCOPIC
Bilirubin Urine: NEGATIVE
Glucose, UA: NEGATIVE
Hgb urine dipstick: NEGATIVE
Ketones, ur: NEGATIVE
Leukocytes,Ua: NEGATIVE
Nitrite: NEGATIVE
Protein, ur: NEGATIVE
Specific Gravity, Urine: 1.014 (ref 1.001–1.035)
pH: 7 (ref 5.0–8.0)

## 2021-07-04 LAB — CBC WITH DIFFERENTIAL/PLATELET
Absolute Monocytes: 598 cells/uL (ref 200–950)
Basophils Absolute: 44 cells/uL (ref 0–200)
Basophils Relative: 0.5 %
Eosinophils Absolute: 70 cells/uL (ref 15–500)
Eosinophils Relative: 0.8 %
HCT: 47.2 % (ref 38.5–50.0)
Hemoglobin: 15.8 g/dL (ref 13.2–17.1)
Lymphs Abs: 2094 cells/uL (ref 850–3900)
MCH: 29 pg (ref 27.0–33.0)
MCHC: 33.5 g/dL (ref 32.0–36.0)
MCV: 86.8 fL (ref 80.0–100.0)
MPV: 11.7 fL (ref 7.5–12.5)
Monocytes Relative: 6.8 %
Neutro Abs: 5993 cells/uL (ref 1500–7800)
Neutrophils Relative %: 68.1 %
Platelets: 219 10*3/uL (ref 140–400)
RBC: 5.44 10*6/uL (ref 4.20–5.80)
RDW: 13.1 % (ref 11.0–15.0)
Total Lymphocyte: 23.8 %
WBC: 8.8 10*3/uL (ref 3.8–10.8)

## 2021-07-04 LAB — PSA: PSA: 1.51 ng/mL (ref <=4.00)

## 2021-07-11 ENCOUNTER — Other Ambulatory Visit: Payer: Self-pay | Admitting: Family Medicine

## 2021-07-11 MED ORDER — TAMSULOSIN HCL 0.4 MG PO CAPS: 0.4000 mg | ORAL_CAPSULE | Freq: Every day | ORAL | 3 refills | Status: DC

## 2021-08-03 ENCOUNTER — Other Ambulatory Visit: Payer: Self-pay | Admitting: Osteopathic Medicine

## 2021-08-05 ENCOUNTER — Telehealth: Payer: Self-pay

## 2021-08-05 NOTE — Telephone Encounter (Signed)
Pt lvm stating not feeling well. No fever. Taking Dayquil. Unable to shake it.   Please schedule patient for appt with next available.

## 2021-08-05 NOTE — Telephone Encounter (Signed)
Pt has been scheduled for a virtual visit for tomorrow morning @ 9 am. AM

## 2021-08-06 ENCOUNTER — Telehealth (INDEPENDENT_AMBULATORY_CARE_PROVIDER_SITE_OTHER): Payer: BC Managed Care – PPO | Admitting: Family Medicine

## 2021-08-06 ENCOUNTER — Encounter: Payer: Self-pay | Admitting: Family Medicine

## 2021-08-06 DIAGNOSIS — J441 Chronic obstructive pulmonary disease with (acute) exacerbation: Secondary | ICD-10-CM | POA: Insufficient documentation

## 2021-08-06 MED ORDER — PREDNISONE 50 MG PO TABS: ORAL_TABLET | ORAL | 0 refills | Status: DC

## 2021-08-06 MED ORDER — DOXYCYCLINE HYCLATE 100 MG PO TABS: 100.0000 mg | ORAL_TABLET | Freq: Two times a day (BID) | ORAL | 0 refills | Status: AC

## 2021-08-06 MED ORDER — BENZONATATE 200 MG PO CAPS: 200.0000 mg | ORAL_CAPSULE | Freq: Two times a day (BID) | ORAL | 0 refills | Status: DC | PRN

## 2021-08-06 NOTE — Assessment & Plan Note (Signed)
Treating with course of doxycycline and prednisone.  Tessalon Perles as needed for cough.  Recommend continuation of current inhalers.  Advise smoking cessation.

## 2021-08-06 NOTE — Progress Notes (Signed)
Anthony Rocha - 50 y.o. male MRN [id number]  Date of birth: 03/29/1971   This visit type was conducted due to national recommendations for restrictions regarding the COVID-19 Pandemic (e.g. social distancing).  This format is felt to be most appropriate for this patient at this time.  All issues noted in this document were discussed and addressed.  No physical exam was performed (except for noted visual exam findings with Video Visits).  I discussed the limitations of evaluation and management by telemedicine and the availability of in person appointments. The patient expressed understanding and agreed to proceed.  I connected withNAME@ on 08/06/21 at  9:10 AM EST by a video enabled telemedicine application and verified that I am speaking with the correct person using two identifiers.  Present at visit: Everrett Coombe, DO Tobie Lords   Patient Location: Home [address] [place] [address]   Provider location:   [facility]   Chief Complaint  Patient presents with   Influenza   Cough    HPI  Anthony Rocha is a 50 y.o. male who presents via audio/video conferencing for a telehealth visit today.  He has complaint of chest congestion, wheezing, mild dyspnea and some nasal congestion.  He reports symptoms started about 5 days ago.  He feels like symptoms have gotten worse since initial onset.  He did take a home COVID test which was negative.  He is using over-the-counter cold medicine with limited relief.  He has not had fever, chills or GI symptoms.   ROS:  A comprehensive ROS was completed and negative except as noted per HPI  Past Medical History:  Diagnosis Date   COPD (chronic obstructive pulmonary disease) (HCC)    Emphysema lung (HCC)     No past surgical history on file.  No family history on file.  Social History   Socioeconomic History   Marital status: Single    Spouse name: Not on file   Number of children: Not on file   Years of  education: Not on file   Highest education level: Not on file  Occupational History   Occupation: DRIVER    Employer: [employer]  Tobacco Use   Smoking status: Every Day    Packs/day: 0.50    Years: 1.00    Pack years: 0.50    Types: Cigarettes    Last attempt to quit: 01/07/2018    Years since quitting: 3.5   Smokeless tobacco: Never   Tobacco comments:    Pt previously smoked for 30 years and quit in 2019. Restarted in 2022.  Vaping Use   Vaping Use: Never used  Substance and Sexual Activity   Alcohol use: Yes    Comment: 12 drinks/wk   Drug use: Never   Sexual activity: Yes    Partners: Female    Birth control/protection: None  Other Topics Concern   Not on file  Social History Narrative   Not on file   Social Determinants of Health   Financial Resource Strain: Not on file  Food Insecurity: Not on file  Transportation Needs: Not on file  Physical Activity: Not on file  Stress: Not on file  Social Connections: Not on file  Intimate Partner Violence: Not on file     Current Outpatient Medications:    albuterol (VENTOLIN HFA) 108 (90 Base) MCG/ACT inhaler, Inhale 2 puffs into the lungs every 6 (six) hours as needed for wheezing or shortness of breath., Disp: 8 g, Rfl: 6  benzonatate (TESSALON) 200 MG capsule, Take 1 capsule (200 mg total) by mouth 2 (two) times daily as needed for cough., Disp: 20 capsule, Rfl: 0   doxycycline (VIBRA-TABS) 100 MG tablet, Take 1 tablet (100 mg total) by mouth 2 (two) times daily for 7 days., Disp: 14 tablet, Rfl: 0   predniSONE (DELTASONE) 50 MG tablet, Take 1 tab po daily x5 days., Disp: 5 tablet, Rfl: 0   SYMBICORT 160-4.5 MCG/ACT inhaler, INHALE 2 PUFFS INTO THE LUNGS TWICE DAILY, Disp: 10.2 g, Rfl: PRN   tamsulosin (FLOMAX) 0.4 MG CAPS capsule, Take 1 capsule (0.4 mg total) by mouth daily., Disp: 30 capsule, Rfl: 3   varenicline (CHANTIX CONTINUING MONTH PAK) 1 MG tablet, Take 1 tablet (1 mg total) by mouth 2 (two)  times daily., Disp: 60 tablet, Rfl: 1   varenicline (CHANTIX STARTING MONTH PAK) 0.5 MG X 11 & 1 MG X 42 tablet, Take one 0.5 mg tablet by mouth once daily for 3 days, then increase to one 0.5 mg tablet twice daily for 4 days, then increase to one 1 mg tablet twice daily., Disp: 53 tablet, Rfl: 0  EXAM:  VITALS per patient if applicable: Wt 218 lb (98.9 kg)   BMI 31.28 kg/m   GENERAL: alert, oriented, appears well and in no acute distress  HEENT: atraumatic, conjunttiva clear, no obvious abnormalities on inspection of external nose and ears  NECK: normal movements of the head and neck  LUNGS: on inspection no signs of respiratory distress, breathing rate appears normal, no obvious gross SOB, gasping or wheezing  CV: no obvious cyanosis  MS: moves all visible extremities without noticeable abnormality  PSYCH/NEURO: pleasant and cooperative, no obvious depression or anxiety, speech and thought processing grossly intact  ASSESSMENT AND PLAN:  Discussed the following assessment and plan:  COPD exacerbation (HCC) Treating with course of doxycycline and prednisone.  Tessalon Perles as needed for cough.  Recommend continuation of current inhalers.  Advise smoking cessation.      I discussed the assessment and treatment plan with the patient. The patient was provided an opportunity to ask questions and all were answered. The patient agreed with the plan and demonstrated an understanding of the instructions.   The patient was advised to call back or seek an in-person evaluation if the symptoms worsen or if the condition fails to improve as anticipated.    Everrett Coombe, DO

## 2021-09-10 ENCOUNTER — Encounter: Payer: Self-pay | Admitting: Physician Assistant

## 2021-09-10 ENCOUNTER — Encounter: Payer: Self-pay | Admitting: Family Medicine

## 2021-09-10 ENCOUNTER — Telehealth: Payer: Self-pay

## 2021-09-10 ENCOUNTER — Telehealth: Payer: BC Managed Care – PPO | Admitting: Physician Assistant

## 2021-09-10 ENCOUNTER — Telehealth: Payer: Self-pay | Admitting: Physician Assistant

## 2021-09-10 DIAGNOSIS — U071 COVID-19: Secondary | ICD-10-CM | POA: Diagnosis not present

## 2021-09-10 MED ORDER — MOLNUPIRAVIR EUA 200MG CAPSULE: 4.0000 | ORAL_CAPSULE | Freq: Two times a day (BID) | ORAL | 0 refills | Status: AC

## 2021-09-10 MED ORDER — BENZONATATE 100 MG PO CAPS: 100.0000 mg | ORAL_CAPSULE | Freq: Three times a day (TID) | ORAL | 0 refills | Status: DC | PRN

## 2021-09-10 NOTE — Telephone Encounter (Signed)
BPA triggered for worsening symptoms diarrhea. Patient called and says he had 3-4 today and none on yesterday. Patient provided information as below. He says he was supposed to be sent in the antiviral to his pharmacy and it was not sent in, he had a virtual visit. I advised to contact PCP to ask if there is anything that can be done about this, he verbalized understanding.  If diarrhea remains the same:  encouraged to drink oral fluids and bland foods, such as crackers, pretzels, soup, bread or applesauce and boiled starches. .  Avoid alcohol, spicy foods, caffeine or fatty foods that could make diarrhea worse.  Continue to monitor for signs of dehydration (increased thirst decreased urine output, yellow urine, dry skin, headache or dizziness).  Advised to try OTC medication (Imodium, kaopectate, Pepto-Bismol) as per manufacturer's instructions. If worsening diarrhea occurs and becomes severe (6-7 bowel movements a day): notify PCP  If diarrhea last greater than 7 days: notify PCP If signs of dehydration occur (increased thirst, decreased urine output, yellow urine, dry skin, headache or dizziness) advise patient to call 911 and seek treatment in the ED

## 2021-09-10 NOTE — Progress Notes (Signed)
Virtual Visit Consent   Anthony Rocha, you are scheduled for a virtual visit with a [facility] provider today.     Just as with appointments in the office, your consent must be obtained to participate.  Your consent will be active for this visit and any virtual visit you may have with one of our providers in the next 365 days.     If you have a MyChart account, a copy of this consent can be sent to you electronically.  All virtual visits are billed to your insurance company just like a traditional visit in the office.    As this is a virtual visit, video technology does not allow for your provider to perform a traditional examination.  This may limit your provider's ability to fully assess your condition.  If your provider identifies any concerns that need to be evaluated in person or the need to arrange testing (such as labs, EKG, etc.), we will make arrangements to do so.     Although advances in technology are sophisticated, we cannot ensure that it will always work on either your end or our end.  If the connection with a video visit is poor, the visit may have to be switched to a telephone visit.  With either a video or telephone visit, we are not always able to ensure that we have a secure connection.     I need to obtain your verbal consent now.   Are you willing to proceed with your visit today?    Anthony Rocha has provided verbal consent on 09/10/2021 for a virtual visit (video or telephone).   Piedad Climes, [place]   Date: 09/10/2021 1:57 PM   Virtual Visit via Video Note   I, Piedad Climes, connected with  Anthony Rocha  ([id number], [birth year]) on 09/10/21 at  1:45 PM EST by a video-enabled telemedicine application and verified that I am speaking with the correct person using two identifiers.  Location: Patient: Virtual Visit Location Patient: Home Provider: Virtual Visit Location Provider: Home Office   I discussed the limitations of evaluation  and management by telemedicine and the availability of in person appointments. The patient expressed understanding and agreed to proceed.    History of Present Illness: Anthony Rocha is a 50 y.o. who identifies as a male who was assigned male at birth, and is being seen today for COVID-19. Patient notes symptoms starting Monday night into Tuesday morning with chills, feverish, significant fatigue, aches. Fever up to 101.6. Notes some nasal and chest congestion. Denies chest pain or SOB. Notes continued sore throat. Tested positive for COVID as of yesterday evening. Has been taking his regular COPD medications (Symbicort, Albuterol) and OTC Dayquil/Nyquil and Mucinex.  HPI: HPI  Problems:  Patient Active Problem List   Diagnosis Date Noted   COPD exacerbation (HCC) 08/06/2021   Nicotine dependence 07/03/2021   Elevated blood pressure reading 01/19/2019   Anxiety 01/19/2019   Nocturia 01/19/2019   Non-nicotine vapor product user 07/20/2018   COPD, severe (HCC) 02/18/2018   Abnormal chest x-ray 02/18/2018    Allergies: No Known Allergies Medications:  Current Outpatient Medications:    benzonatate (TESSALON) 100 MG capsule, Take 1 capsule (100 mg total) by mouth 3 (three) times daily as needed for cough., Disp: 30 capsule, Rfl: 0   albuterol (VENTOLIN HFA) 108 (90 Base) MCG/ACT inhaler, Inhale 2 puffs into the lungs every 6 (six) hours as needed for wheezing or shortness of breath., Disp: 8  g, Rfl: 6   SYMBICORT 160-4.5 MCG/ACT inhaler, INHALE 2 PUFFS INTO THE LUNGS TWICE DAILY, Disp: 10.2 g, Rfl: PRN   varenicline (CHANTIX CONTINUING MONTH PAK) 1 MG tablet, Take 1 tablet (1 mg total) by mouth 2 (two) times daily., Disp: 60 tablet, Rfl: 1   varenicline (CHANTIX STARTING MONTH PAK) 0.5 MG X 11 & 1 MG X 42 tablet, Take one 0.5 mg tablet by mouth once daily for 3 days, then increase to one 0.5 mg tablet twice daily for 4 days, then increase to one 1 mg tablet twice daily., Disp: 53 tablet,  Rfl: 0  Observations/Objective: Patient is well-developed, well-nourished in no acute distress.  Resting comfortably at home.  Head is normocephalic, atraumatic.  No labored breathing. Speech is clear and coherent with logical content.  Patient is alert and oriented at baseline.   Assessment and Plan: 1. COVID-19 - benzonatate (TESSALON) 100 MG capsule; Take 1 capsule (100 mg total) by mouth 3 (three) times daily as needed for cough.  Dispense: 30 capsule; Refill: 0 - MyChart COVID-19 home monitoring program; Future  Patient with multiple risk factors for complicated course of illness. Discussed risks/benefits of antiviral medications including most common potential ADRs. Patient voiced understanding and would like to proceed with antiviral medication. They are candidate for molnupiravir. Rx sent to pharmacy. Supportive measures, OTC medications and vitamin regimen reviewed. Tessalon per orders. Patient has been enrolled in a MyChart COVID symptom monitoring program. Anne Shutter reviewed in detail. Strict ER precautions discussed with patient.   Follow Up Instructions: I discussed the assessment and treatment plan with the patient. The patient was provided an opportunity to ask questions and all were answered. The patient agreed with the plan and demonstrated an understanding of the instructions.  A copy of instructions were sent to the patient via MyChart unless otherwise noted below.   The patient was advised to call back or seek an in-person evaluation if the symptoms worsen or if the condition fails to improve as anticipated.  Time:  I spent 10 minutes with the patient via telehealth technology discussing the above problems/concerns.    Piedad Climes, PA-C

## 2021-09-10 NOTE — Patient Instructions (Signed)
Anthony Rocha, thank you for joining Piedad Climes, PA-C for today's virtual visit.  While this provider is not your primary care provider (PCP), if your PCP is located in our provider database this encounter information will be shared with them immediately following your visit.  Consent: (Patient) Anthony Rocha provided verbal consent for this virtual visit at the beginning of the encounter.  Current Medications:  Current Outpatient Medications:    albuterol (VENTOLIN HFA) 108 (90 Base) MCG/ACT inhaler, Inhale 2 puffs into the lungs every 6 (six) hours as needed for wheezing or shortness of breath., Disp: 8 g, Rfl: 6   SYMBICORT 160-4.5 MCG/ACT inhaler, INHALE 2 PUFFS INTO THE LUNGS TWICE DAILY, Disp: 10.2 g, Rfl: PRN   varenicline (CHANTIX CONTINUING MONTH PAK) 1 MG tablet, Take 1 tablet (1 mg total) by mouth 2 (two) times daily., Disp: 60 tablet, Rfl: 1   varenicline (CHANTIX STARTING MONTH PAK) 0.5 MG X 11 & 1 MG X 42 tablet, Take one 0.5 mg tablet by mouth once daily for 3 days, then increase to one 0.5 mg tablet twice daily for 4 days, then increase to one 1 mg tablet twice daily., Disp: 53 tablet, Rfl: 0   Medications ordered in this encounter:  No orders of the defined types were placed in this encounter.   *If you need refills on other medications prior to your next appointment, please contact your pharmacy*  Follow-Up: Call back or seek an in-person evaluation if the symptoms worsen or if the condition fails to improve as anticipated.  Other Instructions Please keep well-hydrated and get plenty of rest. Start a saline nasal rinse to flush out your nasal passages. You can use plain Mucinex to help thin congestion. If you have a humidifier, running in the bedroom at night. I want you to start OTC vitamin D3 1000 units daily, vitamin C 1000 mg daily, and a zinc supplement. Please take prescribed medications as directed.  You have been enrolled in a MyChart  symptom monitoring program. Please answer these questions daily so we can keep track of how you are doing.  You were to quarantine for 5 days from onset of your symptoms.  After day 5, if you have had no fever and you are feeling better, you can end quarantine but need to mask for an additional 5 days. After day 5 if you have a fever or are having significant symptoms, please quarantine for full 10 days.  If you note any worsening of symptoms, any significant shortness of breath or any chest pain, please seek ER evaluation ASAP.  Please do not delay care!  COVID-19: What to Do if You Are Sick If you test positive and are an older adult or someone who is at high risk of getting very sick from COVID-19, treatment may be available. Contact a healthcare provider right away after a positive test to determine if you are eligible, even if your symptoms are mild right now. You can also visit a Test to Treat location and, if eligible, receive a prescription from a provider. Don't delay: Treatment must be started within the first few days to be effective. If you have a fever, cough, or other symptoms, you might have COVID-19. Most people have mild illness and are able to recover at home. If you are sick: Keep track of your symptoms. If you have an emergency warning sign (including trouble breathing), call 911. Steps to help prevent the spread of COVID-19 if you are sick If  you are sick with COVID-19 or think you might have COVID-19, follow the steps below to care for yourself and to help protect other people in your home and community. Stay home except to get medical care Stay home. Most people with COVID-19 have mild illness and can recover at home without medical care. Do not leave your home, except to get medical care. Do not visit public areas and do not go to places where you are unable to wear a mask. Take care of yourself. Get rest and stay hydrated. Take over-the-counter medicines, such as  acetaminophen, to help you feel better. Stay in touch with your doctor. Call before you get medical care. Be sure to get care if you have trouble breathing, or have any other emergency warning signs, or if you think it is an emergency. Avoid public transportation, ride-sharing, or taxis if possible. Get tested If you have symptoms of COVID-19, get tested. While waiting for test results, stay away from others, including staying apart from those living in your household. Get tested as soon as possible after your symptoms start. Treatments may be available for people with COVID-19 who are at risk for becoming very sick. Don't delay: Treatment must be started early to be effective--some treatments must begin within 5 days of your first symptoms. Contact your healthcare provider right away if your test result is positive to determine if you are eligible. Self-tests are one of several options for testing for the virus that causes COVID-19 and may be more convenient than laboratory-based tests and point-of-care tests. Ask your healthcare provider or your local health department if you need help interpreting your test results. You can visit your state, tribal, local, and territorial health department's website to look for the latest local information on testing sites. Separate yourself from other people As much as possible, stay in a specific room and away from other people and pets in your home. If possible, you should use a separate bathroom. If you need to be around other people or animals in or outside of the home, wear a well-fitting mask. Tell your close contacts that they may have been exposed to COVID-19. An infected person can spread COVID-19 starting 48 hours (or 2 days) before the person has any symptoms or tests positive. By letting your close contacts know they may have been exposed to COVID-19, you are helping to protect everyone. See COVID-19 and Animals if you have questions about pets. If you  are diagnosed with COVID-19, someone from the health department may call you. Answer the call to slow the spread. Monitor your symptoms Symptoms of COVID-19 include fever, cough, or other symptoms. Follow care instructions from your healthcare provider and local health department. Your local health authorities may give instructions on checking your symptoms and reporting information. When to seek emergency medical attention Look for emergency warning signs* for COVID-19. If someone is showing any of these signs, seek emergency medical care immediately: Trouble breathing Persistent pain or pressure in the chest New confusion Inability to wake or stay awake Pale, gray, or blue-colored skin, lips, or nail beds, depending on skin tone *This list is not all possible symptoms. Please call your medical provider for any other symptoms that are severe or concerning to you. Call 911 or call ahead to your local emergency facility: Notify the operator that you are seeking care for someone who has or may have COVID-19. Call ahead before visiting your doctor Call ahead. Many medical visits for routine care are being postponed  or done by phone or telemedicine. If you have a medical appointment that cannot be postponed, call your doctor's office, and tell them you have or may have COVID-19. This will help the office protect themselves and other patients. If you are sick, wear a well-fitting mask You should wear a mask if you must be around other people or animals, including pets (even at home). Wear a mask with the best fit, protection, and comfort for you. You don't need to wear the mask if you are alone. If you can't put on a mask (because of trouble breathing, for example), cover your coughs and sneezes in some other way. Try to stay at least 6 feet away from other people. This will help protect the people around you. Masks should not be placed on young children under age 18 years, anyone who has trouble  breathing, or anyone who is not able to remove the mask without help. Cover your coughs and sneezes Cover your mouth and nose with a tissue when you cough or sneeze. Throw away used tissues in a lined trash can. Immediately wash your hands with soap and water for at least 20 seconds. If soap and water are not available, clean your hands with an alcohol-based hand sanitizer that contains at least 60% alcohol. Clean your hands often Wash your hands often with soap and water for at least 20 seconds. This is especially important after blowing your nose, coughing, or sneezing; going to the bathroom; and before eating or preparing food. Use hand sanitizer if soap and water are not available. Use an alcohol-based hand sanitizer with at least 60% alcohol, covering all surfaces of your hands and rubbing them together until they feel dry. Soap and water are the best option, especially if hands are visibly dirty. Avoid touching your eyes, nose, and mouth with unwashed hands. Handwashing Tips Avoid sharing personal household items Do not share dishes, drinking glasses, cups, eating utensils, towels, or bedding with other people in your home. Wash these items thoroughly after using them with soap and water or put in the dishwasher. Clean surfaces in your home regularly Clean and disinfect high-touch surfaces (for example, doorknobs, tables, handles, light switches, and countertops) in your "sick room" and bathroom. In shared spaces, you should clean and disinfect surfaces and items after each use by the person who is ill. If you are sick and cannot clean, a caregiver or other person should only clean and disinfect the area around you (such as your bedroom and bathroom) on an as needed basis. Your caregiver/other person should wait as long as possible (at least several hours) and wear a mask before entering, cleaning, and disinfecting shared spaces that you use. Clean and disinfect areas that may have blood,  stool, or body fluids on them. Use household cleaners and disinfectants. Clean visible dirty surfaces with household cleaners containing soap or detergent. Then, use a household disinfectant. Use a product from [employer] List N: Disinfectants for Coronavirus (COVID-19). Be sure to follow the instructions on the label to ensure safe and effective use of the product. Many products recommend keeping the surface wet with a disinfectant for a certain period of time (look at "contact time" on the product label). You may also need to wear personal protective equipment, such as gloves, depending on the directions on the product label. Immediately after disinfecting, wash your hands with soap and water for 20 seconds. For completed guidance on cleaning and disinfecting your home, visit Complete Disinfection Guidance. Take steps to  improve ventilation at home Improve ventilation (air flow) at home to help prevent from spreading COVID-19 to other people in your household. Clear out COVID-19 virus particles in the air by opening windows, using air filters, and turning on fans in your home. Use this interactive tool to learn how to improve air flow in your home. When you can be around others after being sick with COVID-19 Deciding when you can be around others is different for different situations. Find out when you can safely end home isolation. For any additional questions about your care, contact your healthcare provider or state or local health department. 12/10/2020 Content source: [facility] ([facility]), Division of Viral Diseases This information is not intended to replace advice given to you by your health care provider. Make sure you discuss any questions you have with your health care provider. Document Revised: 01/23/2021 Document Reviewed: 01/23/2021 Elsevier Patient Education  2022 [employer].   If you have been instructed to have an in-person evaluation  today at a local Urgent Care facility, please use the link below. It will take you to a list of all of our available [facility], including address, phone number and hours of operation. Please do not delay care.  [facility]  If you or a family member do not have a primary care provider, use the link below to schedule a visit and establish care. When you choose a [facility] primary care physician or advanced practice provider, you gain a long-term partner in health. Find a Primary Care Provider  Learn more about [facility]'s in-office and virtual care options: [facility] - Get Care Now

## 2021-09-10 NOTE — Addendum Note (Signed)
Addended by: Waldon Merl on: 09/10/2021 03:30 PM   Modules accepted: Orders

## 2021-09-10 NOTE — Telephone Encounter (Signed)
Pt's wife called in stating that some medication were to be sent into [facility] on [address]. Please advise

## 2021-09-10 NOTE — Telephone Encounter (Signed)
Rx was resubmitted to pharmacy.

## 2021-10-01 DIAGNOSIS — G4710 Hypersomnia, unspecified: Secondary | ICD-10-CM | POA: Diagnosis not present

## 2021-10-01 DIAGNOSIS — R0683 Snoring: Secondary | ICD-10-CM | POA: Diagnosis not present

## 2021-10-01 DIAGNOSIS — R0602 Shortness of breath: Secondary | ICD-10-CM | POA: Diagnosis not present

## 2021-10-01 DIAGNOSIS — J449 Chronic obstructive pulmonary disease, unspecified: Secondary | ICD-10-CM | POA: Diagnosis not present

## 2021-10-03 ENCOUNTER — Other Ambulatory Visit: Payer: Self-pay

## 2021-10-03 ENCOUNTER — Encounter: Payer: Self-pay | Admitting: Family Medicine

## 2021-10-03 ENCOUNTER — Ambulatory Visit (INDEPENDENT_AMBULATORY_CARE_PROVIDER_SITE_OTHER): Payer: BC Managed Care – PPO | Admitting: Family Medicine

## 2021-10-03 DIAGNOSIS — R030 Elevated blood-pressure reading, without diagnosis of hypertension: Secondary | ICD-10-CM

## 2021-10-03 DIAGNOSIS — R635 Abnormal weight gain: Secondary | ICD-10-CM | POA: Insufficient documentation

## 2021-10-03 DIAGNOSIS — F17210 Nicotine dependence, cigarettes, uncomplicated: Secondary | ICD-10-CM | POA: Diagnosis not present

## 2021-10-03 DIAGNOSIS — J449 Chronic obstructive pulmonary disease, unspecified: Secondary | ICD-10-CM | POA: Diagnosis not present

## 2021-10-03 MED ORDER — VARENICLINE TARTRATE 1 MG PO TABS: 1.0000 mg | ORAL_TABLET | Freq: Two times a day (BID) | ORAL | 2 refills | Status: DC

## 2021-10-03 MED ORDER — STIOLTO RESPIMAT 2.5-2.5 MCG/ACT IN AERS: INHALATION_SPRAY | RESPIRATORY_TRACT | 1 refills | Status: DC

## 2021-10-03 MED ORDER — PHENTERMINE HCL 15 MG PO CAPS: 15.0000 mg | ORAL_CAPSULE | ORAL | 1 refills | Status: DC

## 2021-10-03 MED ORDER — PHENTERMINE HCL 15 MG PO CAPS: 15.0000 mg | ORAL_CAPSULE | ORAL | 0 refills | Status: DC

## 2021-10-03 NOTE — Assessment & Plan Note (Signed)
Will continue chantix for a few additional months.

## 2021-10-03 NOTE — Assessment & Plan Note (Signed)
Adding low dose phentermine, continue to monitor BP at home.  Return in 6 weeks.

## 2021-10-03 NOTE — Telephone Encounter (Signed)
Phentermine prescription sent to wrong pharmacy earlier today. Leontine Locket, CMA called [facility] to cancel prescription. Sending new prescription to [facility] on [address] in [place].   Purcell Nails Olevia Bowens, DNP, FNP-C

## 2021-10-03 NOTE — Assessment & Plan Note (Signed)
BP elevated in clinic, however readings at home are well controlled.  Had lightheadedness with flomax.  Recommend low sodium diet and continue with home monitoring.

## 2021-10-03 NOTE — Assessment & Plan Note (Signed)
Seeing pulmonology.  Started on stiolto recently.

## 2021-10-03 NOTE — Progress Notes (Signed)
Anthony Rocha - 51 y.o. male MRN [id number]  Date of birth: [birth year]  Subjective Chief Complaint  Patient presents with   Nicotine Dependence    HPI Anthony Rocha is a 51 y.o. male here today for follow up of nicotine dependence.  He has done well with chantix and tolerating well.  He would like to continue this for a few more months.     He has noted weight gain since quitting smoking.  He would like to have something for appetite suppression.    ROS:  A comprehensive ROS was completed and negative except as noted per HPI  No Known Allergies  Past Medical History:  Diagnosis Date   COPD (chronic obstructive pulmonary disease) (HCC)    Emphysema lung ([place])     History reviewed. No pertinent surgical history.  Social History   Socioeconomic History   Marital status: Married    Spouse name: Not on file   Number of children: Not on file   Years of education: Not on file   Highest education level: Not on file  Occupational History   Occupation: DRIVER    Employer: [employer]  Tobacco Use   Smoking status: Every Day    Packs/day: 0.50    Years: 1.00    Pack years: 0.50    Types: Cigarettes    Last attempt to quit: 01/07/2018    Years since quitting: 3.7   Smokeless tobacco: Never   Tobacco comments:    Pt previously smoked for 30 years and quit in 2019. Restarted in 2022.  Vaping Use   Vaping Use: Never used  Substance and Sexual Activity   Alcohol use: Yes    Comment: 12 drinks/wk   Drug use: Never   Sexual activity: Yes    Partners: Female    Birth control/protection: None  Other Topics Concern   Not on file  Social History Narrative   Not on file   Social Determinants of Health   Financial Resource Strain: Not on file  Food Insecurity: Not on file  Transportation Needs: Not on file  Physical Activity: Not on file  Stress: Not on file  Social Connections: Not on file    History reviewed. No pertinent family  history.  Health Maintenance  Topic Date Due   HIV Screening  Never done   Hepatitis C Screening  Never done   Pneumococcal Vaccine 23-44 Years old (2 - PCV) 02/19/2019   Zoster Vaccines- Shingrix (1 of 2) 10/03/2021 (Originally 07/14/2019)   COVID-19 Vaccine (1) 07/19/2022 (Originally 01/11/1972)   TETANUS/TDAP  01/18/2029   INFLUENZA VACCINE  Completed   HPV VACCINES  Aged Out   COLONOSCOPY (Pts 45-45yrs Insurance coverage will need to be confirmed)  Discontinued     ----------------------------------------------------------------------------------------------------------------------------------------------------------------------------------------------------------------- Physical Exam BP (!) 150/98 (BP Location: Left Arm, Patient Position: Sitting, Cuff Size: Large)    Pulse 79    Ht 5\' 10"  (1.778 m)    Wt 228 lb (103.4 kg)    SpO2 98%    BMI 32.71 kg/m   Physical Exam Constitutional:      Appearance: Normal appearance.  Eyes:     General: No scleral icterus. Cardiovascular:     Rate and Rhythm: Normal rate and regular rhythm.  Pulmonary:     Effort: Pulmonary effort is normal.     Breath sounds: Normal breath sounds.  Musculoskeletal:     Cervical back: Neck supple.  Neurological:     Mental Status: He is alert.  Psychiatric:        Mood and Affect: Mood normal.        Behavior: Behavior normal.    ------------------------------------------------------------------------------------------------------------------------------------------------------------------------------------------------------------------- Assessment and Plan  Elevated blood pressure reading BP elevated in clinic, however readings at home are well controlled.  Had lightheadedness with flomax.  Recommend low sodium diet and continue with home monitoring.    Nicotine dependence Will continue chantix for a few additional months.   COPD, severe [facility]) Seeing pulmonology.  Started on stiolto recently.     Abnormal weight gain Adding low dose phentermine, continue to monitor BP at home.  Return in 6 weeks.    Meds ordered this encounter  Medications   varenicline (CHANTIX CONTINUING MONTH PAK) 1 MG tablet    Sig: Take 1 tablet (1 mg total) by mouth 2 (two) times daily.    Dispense:  60 tablet    Refill:  2   phentermine 15 MG capsule    Sig: Take 1 capsule (15 mg total) by mouth every morning.    Dispense:  30 capsule    Refill:  1    Return in about 6 weeks (around 11/14/2021) for weight mgmt.    This visit occurred during the SARS-CoV-2 public health emergency.  Safety protocols were in place, including screening questions prior to the visit, additional usage of staff PPE, and extensive cleaning of exam room while observing appropriate contact time as indicated for disinfecting solutions.

## 2021-10-17 DIAGNOSIS — Z87891 Personal history of nicotine dependence: Secondary | ICD-10-CM | POA: Diagnosis not present

## 2021-10-20 DIAGNOSIS — D123 Benign neoplasm of transverse colon: Secondary | ICD-10-CM | POA: Diagnosis not present

## 2021-10-20 DIAGNOSIS — Z1211 Encounter for screening for malignant neoplasm of colon: Secondary | ICD-10-CM | POA: Diagnosis not present

## 2021-10-20 DIAGNOSIS — D125 Benign neoplasm of sigmoid colon: Secondary | ICD-10-CM | POA: Diagnosis not present

## 2021-10-20 DIAGNOSIS — D122 Benign neoplasm of ascending colon: Secondary | ICD-10-CM | POA: Diagnosis not present

## 2021-10-20 DIAGNOSIS — F419 Anxiety disorder, unspecified: Secondary | ICD-10-CM | POA: Diagnosis not present

## 2021-10-29 DIAGNOSIS — Z87891 Personal history of nicotine dependence: Secondary | ICD-10-CM | POA: Diagnosis not present

## 2021-10-29 DIAGNOSIS — J449 Chronic obstructive pulmonary disease, unspecified: Secondary | ICD-10-CM | POA: Diagnosis not present

## 2021-10-29 DIAGNOSIS — E6609 Other obesity due to excess calories: Secondary | ICD-10-CM | POA: Diagnosis not present

## 2021-10-29 DIAGNOSIS — Z6832 Body mass index (BMI) 32.0-32.9, adult: Secondary | ICD-10-CM | POA: Diagnosis not present

## 2021-11-12 ENCOUNTER — Ambulatory Visit: Payer: BC Managed Care – PPO | Admitting: Family Medicine

## 2021-11-12 ENCOUNTER — Encounter: Payer: Self-pay | Admitting: Family Medicine

## 2021-11-12 ENCOUNTER — Other Ambulatory Visit: Payer: Self-pay

## 2021-11-12 DIAGNOSIS — E669 Obesity, unspecified: Secondary | ICD-10-CM

## 2021-11-12 DIAGNOSIS — F17210 Nicotine dependence, cigarettes, uncomplicated: Secondary | ICD-10-CM

## 2021-11-12 DIAGNOSIS — R030 Elevated blood-pressure reading, without diagnosis of hypertension: Secondary | ICD-10-CM | POA: Diagnosis not present

## 2021-11-12 MED ORDER — PANTOPRAZOLE SODIUM 40 MG PO TBEC: 40.0000 mg | DELAYED_RELEASE_TABLET | Freq: Every day | ORAL | 3 refills | Status: AC

## 2021-11-12 MED ORDER — SEMAGLUTIDE-WEIGHT MANAGEMENT 1.7 MG/0.75ML ~~LOC~~ SOAJ: 1.7000 mg | SUBCUTANEOUS | 0 refills | Status: AC

## 2021-11-12 MED ORDER — SEMAGLUTIDE-WEIGHT MANAGEMENT 0.5 MG/0.5ML ~~LOC~~ SOAJ: 0.5000 mg | SUBCUTANEOUS | 0 refills | Status: DC

## 2021-11-12 MED ORDER — SEMAGLUTIDE-WEIGHT MANAGEMENT 1 MG/0.5ML ~~LOC~~ SOAJ: 1.0000 mg | SUBCUTANEOUS | 0 refills | Status: DC

## 2021-11-12 MED ORDER — SEMAGLUTIDE-WEIGHT MANAGEMENT 2.4 MG/0.75ML ~~LOC~~ SOAJ: 2.4000 mg | SUBCUTANEOUS | 1 refills | Status: AC

## 2021-11-12 MED ORDER — SEMAGLUTIDE-WEIGHT MANAGEMENT 0.25 MG/0.5ML ~~LOC~~ SOAJ: 0.2500 mg | SUBCUTANEOUS | 0 refills | Status: DC

## 2021-11-12 MED ORDER — SEMAGLUTIDE-WEIGHT MANAGEMENT 2.4 MG/0.75ML ~~LOC~~ SOAJ: 2.4000 mg | SUBCUTANEOUS | 1 refills | Status: DC

## 2021-11-12 MED ORDER — SEMAGLUTIDE-WEIGHT MANAGEMENT 1.7 MG/0.75ML ~~LOC~~ SOAJ: 1.7000 mg | SUBCUTANEOUS | 0 refills | Status: DC

## 2021-11-12 MED ORDER — SEMAGLUTIDE-WEIGHT MANAGEMENT 1 MG/0.5ML ~~LOC~~ SOAJ: 1.0000 mg | SUBCUTANEOUS | 0 refills | Status: AC

## 2021-11-12 MED ORDER — SEMAGLUTIDE-WEIGHT MANAGEMENT 0.25 MG/0.5ML ~~LOC~~ SOAJ: 0.2500 mg | SUBCUTANEOUS | 0 refills | Status: AC

## 2021-11-12 MED ORDER — SEMAGLUTIDE-WEIGHT MANAGEMENT 0.5 MG/0.5ML ~~LOC~~ SOAJ: 0.5000 mg | SUBCUTANEOUS | 0 refills | Status: AC

## 2021-11-12 NOTE — Assessment & Plan Note (Signed)
He has been able to cut back to approximately 1 to 2 cigarettes/day with Chantix.  Encouraged to quit.

## 2021-11-12 NOTE — Assessment & Plan Note (Signed)
Blood pressure remains elevated in clinic.  He does report readings at home have been normal.  1 month nurse visit to recheck blood pressure and recommend that he bring his home cuff with him.

## 2021-11-12 NOTE — Progress Notes (Signed)
Anthony Rocha - 51 y.o. male MRN [id number]  Date of birth: Sep 01, 1971  Subjective Chief Complaint  Patient presents with   Hypertension   Weight Check    HPI Molly Maduro is a 51 year old male here today for follow-up visit.  Reports that overall he is feeling well.  Continues to have some difficulty with weight loss.  Reports of phentermine helped for about a week however has not noticed any continued effects from this.  He would be interested in trying alternatives to help with management of his weight.  Blood pressure is elevated today as well.  Reports that readings at home have been well controlled.  He denies any symptoms related to hypertension including chest pain, shortness of breath, headaches or vision changes.  He has had some reflux symptoms.  Tried some of his wife's omeprazole which helps.  He is requesting prescription for medication to help with reflux symptoms.  ROS:  A comprehensive ROS was completed and negative except as noted per HPI  No Known Allergies  Past Medical History:  Diagnosis Date   COPD (chronic obstructive pulmonary disease) (HCC)    Emphysema lung (HCC)     History reviewed. No pertinent surgical history.  Social History   Socioeconomic History   Marital status: Married    Spouse name: Not on file   Number of children: Not on file   Years of education: Not on file   Highest education level: Not on file  Occupational History   Occupation: DRIVER    Employer: [employer]  Tobacco Use   Smoking status: Every Day    Packs/day: 0.50    Years: 1.00    Pack years: 0.50    Types: Cigarettes    Last attempt to quit: 01/07/2018    Years since quitting: 3.8   Smokeless tobacco: Never   Tobacco comments:    Pt previously smoked for 30 years and quit in 2019. Restarted in 2022.  Vaping Use   Vaping Use: Never used  Substance and Sexual Activity   Alcohol use: Yes    Comment: 12 drinks/wk   Drug use: Never   Sexual activity:  Yes    Partners: Female    Birth control/protection: None  Other Topics Concern   Not on file  Social History Narrative   Not on file   Social Determinants of Health   Financial Resource Strain: Not on file  Food Insecurity: Not on file  Transportation Needs: Not on file  Physical Activity: Not on file  Stress: Not on file  Social Connections: Not on file    History reviewed. No pertinent family history.  Health Maintenance  Topic Date Due   HIV Screening  Never done   Hepatitis C Screening  Never done   Zoster Vaccines- Shingrix (1 of 2) Never done   COVID-19 Vaccine (1) 07/19/2022 (Originally [birth year]/1970)   TETANUS/TDAP  01/18/2029   INFLUENZA VACCINE  Completed   HPV VACCINES  Aged Out   COLONOSCOPY (Pts 45-20yrs Insurance coverage will need to be confirmed)  Discontinued     ----------------------------------------------------------------------------------------------------------------------------------------------------------------------------------------------------------------- Physical Exam BP (!) 155/97    Pulse 85    Ht 5\' 10"  (1.778 m)    Wt 230 lb (104.3 kg)    SpO2 98%    BMI 33.00 kg/m   Physical Exam Constitutional:      Appearance: Normal appearance.  Eyes:     General: No scleral icterus. Cardiovascular:     Rate and Rhythm: Normal rate and  regular rhythm.  Pulmonary:     Effort: Pulmonary effort is normal.     Breath sounds: Normal breath sounds.  Musculoskeletal:     Cervical back: Neck supple.  Neurological:     General: No focal deficit present.     Mental Status: He is alert.  Psychiatric:        Mood and Affect: Mood normal.        Behavior: Behavior normal.    ------------------------------------------------------------------------------------------------------------------------------------------------------------------------------------------------------------------- Assessment and Plan  Obesity (BMI 30-39.9) We discussed additional  options for management of weight.  We will see if insurance about coverage for GLP-1 medications to help with weight loss.  Continue to work on diet and exercise changes.  Nicotine dependence He has been able to cut back to approximately 1 to 2 cigarettes/day with Chantix.  Encouraged to quit.  Elevated blood pressure reading Blood pressure remains elevated in clinic.  He does report readings at home have been normal.  1 month nurse visit to recheck blood pressure and recommend that he bring his home cuff with him.   Meds ordered this encounter  Medications   DISCONTD: Semaglutide-Weight Management 0.25 MG/0.5ML SOAJ    Sig: Inject 0.25 mg into the skin once a week for 28 days.    Dispense:  2 mL    Refill:  0   DISCONTD: Semaglutide-Weight Management 0.5 MG/0.5ML SOAJ    Sig: Inject 0.5 mg into the skin once a week for 28 days.    Dispense:  2 mL    Refill:  0   DISCONTD: Semaglutide-Weight Management 1.7 MG/0.75ML SOAJ    Sig: Inject 1.7 mg into the skin once a week for 28 days.    Dispense:  3 mL    Refill:  0   DISCONTD: Semaglutide-Weight Management 2.4 MG/0.75ML SOAJ    Sig: Inject 2.4 mg into the skin once a week for 28 days.    Dispense:  3 mL    Refill:  1   DISCONTD: Semaglutide-Weight Management 1 MG/0.5ML SOAJ    Sig: Inject 1 mg into the skin once a week for 28 days.    Dispense:  2 mL    Refill:  0   pantoprazole (PROTONIX) 40 MG tablet    Sig: Take 1 tablet (40 mg total) by mouth daily.    Dispense:  30 tablet    Refill:  3   Semaglutide-Weight Management 0.25 MG/0.5ML SOAJ    Sig: Inject 0.25 mg into the skin once a week for 28 days.    Dispense:  2 mL    Refill:  0   Semaglutide-Weight Management 0.5 MG/0.5ML SOAJ    Sig: Inject 0.5 mg into the skin once a week for 28 days.    Dispense:  2 mL    Refill:  0   Semaglutide-Weight Management 1 MG/0.5ML SOAJ    Sig: Inject 1 mg into the skin once a week for 28 days.    Dispense:  2 mL    Refill:  0    Semaglutide-Weight Management 1.7 MG/0.75ML SOAJ    Sig: Inject 1.7 mg into the skin once a week for 28 days.    Dispense:  3 mL    Refill:  0   Semaglutide-Weight Management 2.4 MG/0.75ML SOAJ    Sig: Inject 2.4 mg into the skin once a week for 28 days.    Dispense:  3 mL    Refill:  1    Return in about 3 months (around 02/09/2022)  for Weight/Smoking cessation.    This visit occurred during the SARS-CoV-2 public health emergency.  Safety protocols were in place, including screening questions prior to the visit, additional usage of staff PPE, and extensive cleaning of exam room while observing appropriate contact time as indicated for disinfecting solutions.

## 2021-11-12 NOTE — Assessment & Plan Note (Signed)
We discussed additional options for management of weight.  We will see if insurance about coverage for GLP-1 medications to help with weight loss.  Continue to work on diet and exercise changes.

## 2021-11-12 NOTE — Patient Instructions (Signed)
Let's see if insurance will cover [facility] for weight loss.  Continue to work on quitting smoking.  Monitor BP at home and send readings at home

## 2021-11-19 ENCOUNTER — Telehealth: Payer: Self-pay

## 2021-11-19 NOTE — Telephone Encounter (Addendum)
Initiated Prior authorization for: Wegovy 0.25MG /0.5ML auto-injectors ?Via: Covermymeds ?Case/Key:[id number] ?Status: denied  as of 11/19/21 ?Reason: Member does not have WL benefits. ?Notified Pt via: Mychart ? ? ? ?Initiated Prior authorization TZG:[id number] Sodium 40MG  dr tablets ?Via: Covermymeds ?Case/Key:[id number] ?Status: approved as of 11/19/21 ?Reason:Effective from 11/19/2021 through 11/18/2022. ?Notified Pt via: Mychart ?

## 2021-12-12 ENCOUNTER — Ambulatory Visit: Payer: BC Managed Care – PPO

## 2022-01-07 ENCOUNTER — Other Ambulatory Visit: Payer: Self-pay

## 2022-01-07 DIAGNOSIS — F17210 Nicotine dependence, cigarettes, uncomplicated: Secondary | ICD-10-CM

## 2022-01-07 MED ORDER — VARENICLINE TARTRATE 1 MG PO TABS: 1.0000 mg | ORAL_TABLET | Freq: Two times a day (BID) | ORAL | 2 refills | Status: DC

## 2022-02-10 ENCOUNTER — Ambulatory Visit: Payer: BC Managed Care – PPO | Admitting: Family Medicine

## 2022-07-23 ENCOUNTER — Encounter: Payer: Self-pay | Admitting: Family Medicine

## 2022-07-23 ENCOUNTER — Ambulatory Visit: Payer: BC Managed Care – PPO | Admitting: Family Medicine

## 2022-07-23 VITALS — BP 153/90 | HR 80 | Ht 70.0 in | Wt 242.0 lb

## 2022-07-23 DIAGNOSIS — J449 Chronic obstructive pulmonary disease, unspecified: Secondary | ICD-10-CM | POA: Diagnosis not present

## 2022-07-23 DIAGNOSIS — L989 Disorder of the skin and subcutaneous tissue, unspecified: Secondary | ICD-10-CM

## 2022-07-23 DIAGNOSIS — R635 Abnormal weight gain: Secondary | ICD-10-CM

## 2022-07-23 DIAGNOSIS — I10 Essential (primary) hypertension: Secondary | ICD-10-CM | POA: Insufficient documentation

## 2022-07-23 MED ORDER — VALSARTAN 80 MG PO TABS: 80.0000 mg | ORAL_TABLET | Freq: Every day | ORAL | 0 refills | Status: DC

## 2022-07-23 MED ORDER — STIOLTO RESPIMAT 2.5-2.5 MCG/ACT IN AERS
INHALATION_SPRAY | RESPIRATORY_TRACT | 6 refills | Status: DC
Start: 2022-07-23 — End: 2023-02-24

## 2022-07-23 NOTE — Assessment & Plan Note (Signed)
BP remains elevated today.  Adding valsartan 80mg  daily.  Recommend low sodium diet and weight loss.  F/u in 2 weeks for nurse visit and update labs at that time.

## 2022-07-23 NOTE — Patient Instructions (Addendum)
Start valsartan 80 mg daily Return in 2 weeks for blood pressure check and fasting labs.     Preventing Unhealthy Weight Gain, Adult Staying at a healthy weight is important to your overall health. When fat builds up in your body, you may become overweight or obese. Being overweight or obese increases your risk of developing various health problems. Unhealthy weight gain is often the result of making unhealthy food choices or not getting enough exercise. You can make changes to your lifestyle to prevent obesity and stay as healthy as possible. How can unhealthy weight gain affect me? Being overweight or obese can cause you to develop joint or bone problems, which can make it hard for you to stay active or do activities you enjoy. Being overweight also puts stress on your heart and lungs and can lead to health problems such as: Diabetes. Heart disease. Some types of cancer. Stroke. Eating healthy, staying active, and having healthy habits can help to prevent unhealthy weight gain and lower your risk for health problems. These lifestyle changes will also help you manage stress and emotions, improve your self-esteem, and connect with friends and family. What can increase my risk? In addition to certain eating and lifestyle choices, some other factors that may make you more likely to have unhealthy weight gain include: Having a family history of obesity. Living in an area with limited access to: [place], recreation centers, or sidewalks. Healthy food choices, such as grocery stores and farmers' markets. What actions can I take to prevent unhealthy weight gain? Nutrition  Eat only as much as your body needs. To do this: Pay attention to signs that you are hungry or full. Stop eating as soon as you feel full. If you feel hungry, try drinking water first before eating. Drink enough water so your urine is pale yellow. Eat smaller portions. Pay attention to portion sizes when eating out. Look at  serving sizes on food labels. Most foods contain more than one serving per container. Eat the recommended number of calories for your gender and activity level. For most active people, a daily total of 2,000 calories is appropriate. If you are trying to lose weight or are not very active, you may need to eat fewer calories. Talk with your health care provider or a dietitian about how many calories you need each day. Choose healthy foods, such as: Fruits and vegetables. At each meal, try to fill at least half of your plate with fruits and vegetables. Whole grains, such as whole-wheat bread, brown rice, and quinoa. Lean meats, such as chicken or fish. Other healthy proteins, such as beans, eggs, or tofu. Healthy fats, such as nuts, seeds, fatty fish, and olive oil. Low-fat or fat-free dairy products. Check food labels, and avoid food and drinks that: Are high in calories. Have added sugar. Are high in sodium. Have saturated fats or trans fats. Cook foods in healthier ways, such as by baking, broiling, or grilling. Make a meal plan for the week, and shop with a grocery list to help you stay on track with your purchases. Try to avoid going to the grocery store when you are hungry. When grocery shopping, try to shop around the outside of the store first, where the fresh foods are. Doing this helps you avoid prepackaged foods, which can be high in sugar, salt (sodium), and fat. Lifestyle  Exercise for 30 or more minutes on 5 or more days each week. Exercising may include brisk walking, yard work, biking, running, swimming,  and team sports like basketball and soccer. Ask your health care provider which exercises are safe for you. Do activities that strengthen the muscles, such as lifting weights or using resistance bands, on 2 or more days a week. Do not use any products that contain nicotine or tobacco. These products include cigarettes, chewing tobacco, and vaping devices, such as e-cigarettes. If  you need help quitting, ask your health care provider. If you drink alcohol: Limit how much you have to: 0-1 drink a day for women who are not pregnant. 0-2 drinks a day for men. Know how much alcohol is in a drink. In the [place] one drink equals one 12 oz bottle of beer (355 mL), one 5 oz glass of wine (148 mL), or one 1 oz glass of hard liquor (44 mL). Try to get 7-9 hours of sleep each night. Other changes Keep a food and activity journal to keep track of: What you ate and how many calories you had. Remember to count the calories in sauces, dressings, and side dishes. Whether you were active, and what exercises you did. Your calorie, weight, and activity goals. Check your weight regularly. Track any changes. If you notice that you have gained weight, make changes to your diet or activity routine. Avoid taking weight-loss medicines or supplements. Talk to your health care provider before starting any new medicine or supplement. Talk to your health care provider before trying any new diet or exercise plan. Where to find more information Talk with your health care provider or a dietitian about healthy eating and healthy lifestyle choices. You may also find information from: U.S. Department of Agriculture, MyPlate: FormerBoss.no American Heart Association: www.heart.org Centers for Disease Control and Prevention: http://www.wolf.info/ Summary Eating healthy, staying active, and having healthy habits can help to prevent unhealthy weight gain and lower your risk for health problems such as heart disease, diabetes, some types of cancer, and stroke. Being overweight or obese can cause you to develop joint or bone problems, which can make it hard for you to stay active or do activities you enjoy. You can prevent unhealthy weight gain by eating a healthy diet, exercising regularly, not smoking, limiting alcohol, and getting enough sleep. Talk with your health care provider or a dietitian for guidance  about healthy eating and healthy lifestyle choices. This information is not intended to replace advice given to you by your health care provider. Make sure you discuss any questions you have with your health care provider. Document Revised: 04/04/2021 Document Reviewed: 04/04/2021 Elsevier Patient Education  [place].

## 2022-07-23 NOTE — Progress Notes (Signed)
Anthony Rocha - 51 y.o. male MRN [id number]  Date of birth: 1971/03/05  Subjective Chief Complaint  Patient presents with   Follow-up    HPI Anthony Rocha is a 51 y.o. male here today for follow up visit.   He has done quite well with chantix and has been able to remain off of tobacco products for the past year.  He does have cravings occasionally when around other people who smoke but has been able to abstain.  He is nearly out of chantix at this point.  He has noted some weight gain since quitting smoking.  He reports that diet has not been very good as he tends to eat late at night and often makes poor choices.  He also reports that he is not very active.    He continues to see pulmonology for management of COPD.  Remains on stiolto daily with albuterol as needed.  Stable at this time.  Denies increased wheezing or dyspnea.    BP remains elevated.  Denies symptoms related to HTN including chest pain, shortness of breath, palpitations, headache or vision changes.    ROS:  A comprehensive ROS was completed and negative except as noted per HPI  No Known Allergies  Past Medical History:  Diagnosis Date   COPD (chronic obstructive pulmonary disease) (HCC)    Emphysema lung (HCC)     No past surgical history on file.  Social History   Socioeconomic History   Marital status: Married    Spouse name: Not on file   Number of children: Not on file   Years of education: Not on file   Highest education level: Not on file  Occupational History   Occupation: DRIVER    Employer: [employer]  Tobacco Use   Smoking status: Every Day    Packs/day: 0.50    Years: 1.00    Total pack years: 0.50    Types: Cigarettes    Last attempt to quit: 01/07/2018    Years since quitting: 4.5   Smokeless tobacco: Never   Tobacco comments:    Pt previously smoked for 30 years and quit in 2019. Restarted in 2022.  Vaping Use   Vaping Use: Never used  Substance and Sexual  Activity   Alcohol use: Yes    Comment: 12 drinks/wk   Drug use: Never   Sexual activity: Yes    Partners: Female    Birth control/protection: None  Other Topics Concern   Not on file  Social History Narrative   Not on file   Social Determinants of Health   Financial Resource Strain: Not on file  Food Insecurity: Not on file  Transportation Needs: Not on file  Physical Activity: Not on file  Stress: Not on file  Social Connections: Not on file    No family history on file.  Health Maintenance  Topic Date Due   COVID-19 Vaccine (1) 08/08/2022 (Originally 01/11/1970)   Zoster Vaccines- Shingrix (1 of 2) 10/23/2022 (Originally 07/14/2019)   Hepatitis C Screening  07/24/2023 (Originally 07/14/1987)   HIV Screening  07/24/2023 (Originally 07/13/1984)   TETANUS/TDAP  01/18/2029   INFLUENZA VACCINE  Completed   HPV VACCINES  Aged Out   COLONOSCOPY (Pts 45-72yrs Insurance coverage will need to be confirmed)  Discontinued     ----------------------------------------------------------------------------------------------------------------------------------------------------------------------------------------------------------------- Physical Exam BP (!) 153/90   Pulse 80   Ht 5\' 10"  (1.778 m)   Wt 242 lb (109.8 kg)   SpO2 99%   BMI 34.72  kg/m   Physical Exam Constitutional:      Appearance: Normal appearance.  Eyes:     General: No scleral icterus. Cardiovascular:     Rate and Rhythm: Normal rate and regular rhythm.  Pulmonary:     Effort: Pulmonary effort is normal.     Breath sounds: Normal breath sounds.  Musculoskeletal:     Cervical back: Neck supple.  Neurological:     General: No focal deficit present.     Mental Status: He is alert.  Psychiatric:        Mood and Affect: Mood normal.        Behavior: Behavior normal.      ------------------------------------------------------------------------------------------------------------------------------------------------------------------------------------------------------------------- Assessment and Plan  Essential hypertension BP remains elevated today.  Adding valsartan 80mg  daily.  Recommend low sodium diet and weight loss.  F/u in 2 weeks for nurse visit and update labs at that time.    COPD, severe ([place]) Doing well with stiolto.  He has not needed rescue inhaler recently.  He has been able to quit smoking/vaping.    Abnormal weight gain Discussed working on dietary change.  Try to incorporate more physical activity.  Updated labs ordered.   Skin lesion Dermatology referral.    Meds ordered this encounter  Medications   DISCONTD: valsartan (DIOVAN) 80 MG tablet    Sig: Take 1 tablet (80 mg total) by mouth daily.    Dispense:  90 tablet    Refill:  0   STIOLTO RESPIMAT 2.5-2.5 MCG/ACT AERS    Sig: SMARTSIG:2 Puff(s) Via Inhaler Daily    Dispense:  4 g    Refill:  6   valsartan (DIOVAN) 80 MG tablet    Sig: Take 1 tablet (80 mg total) by mouth daily.    Dispense:  90 tablet    Refill:  0    Return in about 2 weeks (around 08/06/2022) for Nurse visit-BP check/Fasting labs. .    This visit occurred during the SARS-CoV-2 public health emergency.  Safety protocols were in place, including screening questions prior to the visit, additional usage of staff PPE, and extensive cleaning of exam room while observing appropriate contact time as indicated for disinfecting solutions.

## 2022-07-23 NOTE — Assessment & Plan Note (Signed)
Discussed working on dietary change.  Try to incorporate more physical activity.  Updated labs ordered.

## 2022-07-23 NOTE — Assessment & Plan Note (Signed)
Dermatology referral 

## 2022-07-23 NOTE — Assessment & Plan Note (Signed)
Doing well with stiolto.  He has not needed rescue inhaler recently.  He has been able to quit smoking/vaping.

## 2022-08-05 ENCOUNTER — Ambulatory Visit (INDEPENDENT_AMBULATORY_CARE_PROVIDER_SITE_OTHER): Payer: BC Managed Care – PPO | Admitting: Family Medicine

## 2022-08-05 VITALS — BP 139/90 | HR 90 | Ht 70.0 in | Wt 237.9 lb

## 2022-08-05 DIAGNOSIS — Z125 Encounter for screening for malignant neoplasm of prostate: Secondary | ICD-10-CM | POA: Diagnosis not present

## 2022-08-05 DIAGNOSIS — I10 Essential (primary) hypertension: Secondary | ICD-10-CM

## 2022-08-05 NOTE — Progress Notes (Signed)
   Established Patient Office Visit  Subjective   Patient ID: Anthony Rocha, male    DOB: 05-13-1971  Age: 51 y.o. MRN: [id number]  Chief Complaint  Patient presents with   Hypertension    Nurse visit - BP check    HPI  Hypertension- BP check -nurse visit- patient denies current  chest pain, shortness of breath, palpitations., headache, vision changes,  or problems with medication.. ( he did state at initial start of Valsartan 80mg   had the slight occasional chest pain that resolved after first 2 days on medication and no other problems since this,)   ROS    Objective:     BP (!) 139/90   Pulse 90   Ht 5\' 10"  (1.778 m)   Wt 237 lb 14.4 oz (107.9 kg)   SpO2 98%   BMI 34.14 kg/m    Physical Exam   No results found for any visits on 08/05/22.    The ASCVD Risk score (Arnett DK, et al., 2019) failed to calculate for the following reasons:   Cannot find a previous HDL lab   Cannot find a previous total cholesterol lab    Assessment & Plan:  Hypertension- BP check  - patient initial reading 148/103 second reading 139/90. Per 08/07/22. Patient is to continue on current medication ( Valsartan 80mg  )  and watch salt intake and recheck BP again in 2 weeks at nurse visit - return for visit with him in 6 months.  Problem List Items Addressed This Visit       Cardiovascular and Mediastinum   Essential hypertension - Primary   Relevant Orders   CBC w/Diff/Platelet   COMPLETE METABOLIC PANEL WITH GFR   Lipid Panel w/reflex Direct LDL   Urinalysis, Routine w reflex microscopic   PSA   Other Visit Diagnoses     Screening for prostate cancer       Relevant Orders   PSA       Return in about 2 weeks (around 08/19/2022) for nurse visit - BP check .    [employer], LPN

## 2022-08-05 NOTE — Patient Instructions (Signed)
continue on current medication ( Valsartan 80mg  )  and watch salt intake -recheck BP again in 2 weeks at nurse visit - return for visit with him in 6 months

## 2022-08-05 NOTE — Progress Notes (Signed)
Medical screening examination/treatment was performed by qualified clinical staff member and as supervising physician I was immediately available for consultation/collaboration. I have reviewed documentation and agree with assessment and plan.  Mohd. Derflinger, DO  

## 2022-08-06 LAB — CBC WITH DIFFERENTIAL/PLATELET
Absolute Monocytes: 614 cells/uL (ref 200–950)
Basophils Absolute: 45 cells/uL (ref 0–200)
Basophils Relative: 0.5 %
Eosinophils Absolute: 89 cells/uL (ref 15–500)
Eosinophils Relative: 1 %
HCT: 45.3 % (ref 38.5–50.0)
Hemoglobin: 15.4 g/dL (ref 13.2–17.1)
Lymphs Abs: 2234 cells/uL (ref 850–3900)
MCH: 29.2 pg (ref 27.0–33.0)
MCHC: 34 g/dL (ref 32.0–36.0)
MCV: 86 fL (ref 80.0–100.0)
MPV: 11.5 fL (ref 7.5–12.5)
Monocytes Relative: 6.9 %
Neutro Abs: 5919 cells/uL (ref 1500–7800)
Neutrophils Relative %: 66.5 %
Platelets: 224 10*3/uL (ref 140–400)
RBC: 5.27 10*6/uL (ref 4.20–5.80)
RDW: 13.2 % (ref 11.0–15.0)
Total Lymphocyte: 25.1 %
WBC: 8.9 10*3/uL (ref 3.8–10.8)

## 2022-08-06 LAB — LIPID PANEL W/REFLEX DIRECT LDL
Cholesterol: 215 mg/dL — ABNORMAL HIGH (ref <200)
HDL: 42 mg/dL (ref >=40)
LDL Cholesterol (Calc): 136 mg/dL (calc) — ABNORMAL HIGH
Non-HDL Cholesterol (Calc): 173 mg/dL (calc) — ABNORMAL HIGH (ref <130)
Total CHOL/HDL Ratio: 5.1 (calc) — ABNORMAL HIGH (ref <5.0)
Triglycerides: 230 mg/dL — ABNORMAL HIGH (ref <150)

## 2022-08-06 LAB — COMPLETE METABOLIC PANEL WITH GFR
AG Ratio: 1.6 (calc) (ref 1.0–2.5)
ALT: 26 U/L (ref 9–46)
AST: 16 U/L (ref 10–35)
Albumin: 4.2 g/dL (ref 3.6–5.1)
Alkaline phosphatase (APISO): 70 U/L (ref 35–144)
BUN: 18 mg/dL (ref 7–25)
CO2: 26 mmol/L (ref 20–32)
Calcium: 9.2 mg/dL (ref 8.6–10.3)
Chloride: 107 mmol/L (ref 98–110)
Creat: 1.16 mg/dL (ref 0.70–1.30)
Globulin: 2.6 g/dL (calc) (ref 1.9–3.7)
Glucose, Bld: 115 mg/dL — ABNORMAL HIGH (ref 65–99)
Potassium: 5.5 mmol/L — ABNORMAL HIGH (ref 3.5–5.3)
Sodium: 142 mmol/L (ref 135–146)
Total Bilirubin: 0.5 mg/dL (ref 0.2–1.2)
Total Protein: 6.8 g/dL (ref 6.1–8.1)
eGFR: 75 mL/min/{1.73_m2} (ref >=60)

## 2022-08-06 LAB — URINALYSIS, ROUTINE W REFLEX MICROSCOPIC
Bilirubin Urine: NEGATIVE
Glucose, UA: NEGATIVE
Hgb urine dipstick: NEGATIVE
Ketones, ur: NEGATIVE
Leukocytes,Ua: NEGATIVE
Nitrite: NEGATIVE
Protein, ur: NEGATIVE
Specific Gravity, Urine: 1.019 (ref 1.001–1.035)
pH: 6.5 (ref 5.0–8.0)

## 2022-08-06 LAB — PSA: PSA: 1.53 ng/mL (ref <=4.00)

## 2022-08-07 ENCOUNTER — Other Ambulatory Visit: Payer: Self-pay | Admitting: Family Medicine

## 2022-08-07 DIAGNOSIS — I10 Essential (primary) hypertension: Secondary | ICD-10-CM

## 2022-08-21 ENCOUNTER — Other Ambulatory Visit: Payer: Self-pay | Admitting: Family Medicine

## 2022-08-21 ENCOUNTER — Telehealth: Payer: Self-pay

## 2022-08-21 ENCOUNTER — Ambulatory Visit (INDEPENDENT_AMBULATORY_CARE_PROVIDER_SITE_OTHER): Payer: BC Managed Care – PPO | Admitting: Family Medicine

## 2022-08-21 VITALS — BP 142/97 | HR 90 | Resp 20

## 2022-08-21 DIAGNOSIS — I10 Essential (primary) hypertension: Secondary | ICD-10-CM

## 2022-08-21 MED ORDER — VALSARTAN 160 MG PO TABS: 160.0000 mg | ORAL_TABLET | Freq: Every day | ORAL | 1 refills | Status: DC

## 2022-08-21 NOTE — Telephone Encounter (Signed)
Patients spouse called about patients medication valsartan that was sent to [facility], that patient needs it sent to [facility] [address], [place], [place] pharmacy has been deleted out of chart!

## 2022-08-21 NOTE — Progress Notes (Signed)
Medical screening examination/treatment was performed by qualified clinical staff member and as supervising physician I was immediately available for consultation/collaboration. I have reviewed documentation and agree with assessment and plan.  Seth Higginbotham, DO  

## 2022-08-21 NOTE — Progress Notes (Signed)
   Subjective:    Patient ID: Anthony Rocha, male    DOB: Jan 22, 1971, 51 y.o.   MRN: [id number]  HPI  Patient is here for blood pressure. Denies trouble sleeping, palpitations or medication problems.   Review of Systems     Objective:   Physical Exam        Assessment & Plan:   Dr. Anastasio Auerbach increased Valsartan from 80 MG to 160 MG. A refill for Valsartan 160 MG was sent to the pharmacy. Patient advised to return for nurse visit for Blood Pressure check in 1 month.

## 2022-08-21 NOTE — Telephone Encounter (Signed)
Rx sent to updated pharmacy

## 2022-08-22 LAB — BASIC METABOLIC PANEL
BUN: 11 mg/dL (ref 7–25)
CO2: 23 mmol/L (ref 20–32)
Calcium: 9.2 mg/dL (ref 8.6–10.3)
Chloride: 107 mmol/L (ref 98–110)
Creat: 0.94 mg/dL (ref 0.70–1.30)
Glucose, Bld: 113 mg/dL — ABNORMAL HIGH (ref 65–99)
Potassium: 4.4 mmol/L (ref 3.5–5.3)
Sodium: 140 mmol/L (ref 135–146)

## 2022-09-07 DIAGNOSIS — Z87891 Personal history of nicotine dependence: Secondary | ICD-10-CM | POA: Diagnosis not present

## 2022-09-07 DIAGNOSIS — J449 Chronic obstructive pulmonary disease, unspecified: Secondary | ICD-10-CM | POA: Diagnosis not present

## 2022-09-07 DIAGNOSIS — G4710 Hypersomnia, unspecified: Secondary | ICD-10-CM | POA: Diagnosis not present

## 2022-09-09 ENCOUNTER — Telehealth: Payer: Self-pay

## 2022-09-09 NOTE — Telephone Encounter (Signed)
Pt lvm stating he feels bad. Please contact the patient to schedule an in-office appt for testing. Thanks

## 2022-09-10 ENCOUNTER — Encounter: Payer: Self-pay | Admitting: Family Medicine

## 2022-09-10 ENCOUNTER — Telehealth (INDEPENDENT_AMBULATORY_CARE_PROVIDER_SITE_OTHER): Payer: BC Managed Care – PPO | Admitting: Family Medicine

## 2022-09-10 VITALS — Ht 70.0 in | Wt 240.0 lb

## 2022-09-10 DIAGNOSIS — J111 Influenza due to unidentified influenza virus with other respiratory manifestations: Secondary | ICD-10-CM | POA: Insufficient documentation

## 2022-09-10 MED ORDER — PREDNISONE 50 MG PO TABS: ORAL_TABLET | ORAL | 0 refills | Status: DC

## 2022-09-10 MED ORDER — OSELTAMIVIR PHOSPHATE 75 MG PO CAPS: 75.0000 mg | ORAL_CAPSULE | Freq: Two times a day (BID) | ORAL | 0 refills | Status: AC

## 2022-09-10 NOTE — Assessment & Plan Note (Signed)
Negative for COVID and symptoms are consistent with influenza-like illness.  Will go ahead and start Tamiflu if he is still within window especially with comorbidities of COPD.  So far no significant flares of COPD however we did discuss adding prednisone if he does develop wheezing or shortness of breath.  Since there is prolonged holiday weekend I will go ahead and call this in keep to on file for him if needed.

## 2022-09-10 NOTE — Progress Notes (Signed)
Anthony Rocha - 51 y.o. male MRN [id number]  Date of birth: 11/15/1970   This visit type was conducted due to national recommendations for restrictions regarding the COVID-19 Pandemic (e.g. social distancing).  This format is felt to be most appropriate for this patient at this time.  All issues noted in this document were discussed and addressed.  No physical exam was performed (except for noted visual exam findings with Video Visits).  I discussed the limitations of evaluation and management by telemedicine and the availability of in person appointments. The patient expressed understanding and agreed to proceed.  I connected withNAME@ on 09/10/22 at 11:30 AM EST by a video enabled telemedicine application and verified that I am speaking with the correct person using two identifiers.  Present at visit: Everrett Coombe, DO Tobie Lords   Patient Location: Home [address] [place] [address]   Provider location:   [facility]  Chief Complaint  Patient presents with   URI    HPI  Anthony Rocha is a 51 y.o. male who presents via audio/video conferencing for a telehealth visit today.  Complaint of congestion, fatigue, body aches, mild headache, cough and fever.  Symptoms started about 2 days ago.  He has tested negative for COVID at home.  He denies wheezing, shortness of breath at this time.  He does have history of COPD.   ROS:  A comprehensive ROS was completed and negative except as noted per HPI  Past Medical History:  Diagnosis Date   COPD (chronic obstructive pulmonary disease) (HCC)    Emphysema lung (HCC)     History reviewed. No pertinent surgical history.  History reviewed. No pertinent family history.  Social History   Socioeconomic History   Marital status: Married    Spouse name: Not on file   Number of children: Not on file   Years of education: Not on file   Highest education level: Not on file  Occupational History   Occupation:  DRIVER    Employer: [employer]  Tobacco Use   Smoking status: Every Day    Packs/day: 0.50    Years: 1.00    Total pack years: 0.50    Types: Cigarettes    Last attempt to quit: 01/07/2018    Years since quitting: 4.6   Smokeless tobacco: Never   Tobacco comments:    Pt previously smoked for 30 years and quit in 2019. Restarted in 2022.  Vaping Use   Vaping Use: Never used  Substance and Sexual Activity   Alcohol use: Yes    Comment: 12 drinks/wk   Drug use: Never   Sexual activity: Yes    Partners: Female    Birth control/protection: None  Other Topics Concern   Not on file  Social History Narrative   Not on file   Social Determinants of Health   Financial Resource Strain: Not on file  Food Insecurity: Not on file  Transportation Needs: Not on file  Physical Activity: Not on file  Stress: Not on file  Social Connections: Not on file  Intimate Partner Violence: Not on file     Current Outpatient Medications:    albuterol (VENTOLIN HFA) 108 (90 Base) MCG/ACT inhaler, Inhale 2 puffs into the lungs every 6 (six) hours as needed for wheezing or shortness of breath., Disp: 8 g, Rfl: 6   oseltamivir (TAMIFLU) 75 MG capsule, Take 1 capsule (75 mg total) by mouth 2 (two) times daily for 5 days., Disp:  10 capsule, Rfl: 0   pantoprazole (PROTONIX) 40 MG tablet, Take 1 tablet (40 mg total) by mouth daily., Disp: 30 tablet, Rfl: 3   predniSONE (DELTASONE) 50 MG tablet, Take 50mg  daily x5 days., Disp: 5 tablet, Rfl: 0   STIOLTO RESPIMAT 2.5-2.5 MCG/ACT AERS, SMARTSIG:2 Puff(s) Via Inhaler Daily, Disp: 4 g, Rfl: 6   valsartan (DIOVAN) 160 MG tablet, Take 1 tablet (160 mg total) by mouth daily., Disp: 90 tablet, Rfl: 1   varenicline (CHANTIX CONTINUING MONTH PAK) 1 MG tablet, Take 1 tablet (1 mg total) by mouth 2 (two) times daily., Disp: 60 tablet, Rfl: 2  EXAM:  VITALS per patient if applicable: Ht 5\' 10"  (1.778 m)   Wt 240 lb (108.9 kg)   BMI 34.44 kg/m    GENERAL: alert, oriented, appears well and in no acute distress  HEENT: atraumatic, conjunttiva clear, no obvious abnormalities on inspection of external nose and ears  NECK: normal movements of the head and neck  LUNGS: on inspection no signs of respiratory distress, breathing rate appears normal, no obvious gross SOB, gasping or wheezing  CV: no obvious cyanosis  MS: moves all visible extremities without noticeable abnormality  PSYCH/NEURO: pleasant and cooperative, no obvious depression or anxiety, speech and thought processing grossly intact  ASSESSMENT AND PLAN:  Discussed the following assessment and plan:  Influenza-like illness Negative for COVID and symptoms are consistent with influenza-like illness.  Will go ahead and start Tamiflu if he is still within window especially with comorbidities of COPD.  So far no significant flares of COPD however we did discuss adding prednisone if he does develop wheezing or shortness of breath.  Since there is prolonged holiday weekend I will go ahead and call this in keep to on file for him if needed.      I discussed the assessment and treatment plan with the patient. The patient was provided an opportunity to ask questions and all were answered. The patient agreed with the plan and demonstrated an understanding of the instructions.   The patient was advised to call back or seek an in-person evaluation if the symptoms worsen or if the condition fails to improve as anticipated.    , DO

## 2022-09-10 NOTE — Progress Notes (Signed)
Fever on Tuesday but it has broken. No fever today.  Symptoms: sore throat, yellow mucus, body aches  COVID: negative  Medication: Dayquil

## 2022-09-10 NOTE — Telephone Encounter (Signed)
Called patient to get a appointment scheduled and no one answered, couldn't leave a voicemail. Tvt

## 2022-09-22 ENCOUNTER — Ambulatory Visit (INDEPENDENT_AMBULATORY_CARE_PROVIDER_SITE_OTHER): Payer: BC Managed Care – PPO | Admitting: Family Medicine

## 2022-09-22 VITALS — BP 128/94 | HR 86 | Ht 70.0 in | Wt 240.0 lb

## 2022-09-22 DIAGNOSIS — I10 Essential (primary) hypertension: Secondary | ICD-10-CM | POA: Diagnosis not present

## 2022-09-22 NOTE — Progress Notes (Signed)
   Subjective:    Patient ID: Anthony Rocha, male    DOB: 30-Dec-1970, 52 y.o.   MRN: [id number]  HPI Pt here for blood pressure check, pt denies any blurred vision, headaches or missed doses of medication.       Review of Systems     Objective:   Physical Exam        Assessment & Plan:  Pts second bp today was 128/94, pt intructed to do home monitoring of bp and continue medication as directed without any changes. Follow up at next scheduled appointment.

## 2022-09-22 NOTE — Progress Notes (Signed)
Medical screening examination/treatment was performed by qualified clinical staff member and as supervising physician I was immediately available for consultation/collaboration. I have reviewed documentation and agree with assessment and plan.  Omayra Tulloch, DO  

## 2022-10-01 DIAGNOSIS — B079 Viral wart, unspecified: Secondary | ICD-10-CM | POA: Diagnosis not present

## 2022-10-01 DIAGNOSIS — L820 Inflamed seborrheic keratosis: Secondary | ICD-10-CM | POA: Diagnosis not present

## 2022-10-01 DIAGNOSIS — L720 Epidermal cyst: Secondary | ICD-10-CM | POA: Diagnosis not present

## 2022-10-01 DIAGNOSIS — L814 Other melanin hyperpigmentation: Secondary | ICD-10-CM | POA: Diagnosis not present

## 2022-10-19 ENCOUNTER — Other Ambulatory Visit: Payer: Self-pay | Admitting: Family Medicine

## 2022-10-19 DIAGNOSIS — Z87891 Personal history of nicotine dependence: Secondary | ICD-10-CM | POA: Diagnosis not present

## 2022-11-11 ENCOUNTER — Ambulatory Visit: Payer: BC Managed Care – PPO | Admitting: Family Medicine

## 2022-11-11 ENCOUNTER — Encounter: Payer: Self-pay | Admitting: Family Medicine

## 2022-11-11 ENCOUNTER — Ambulatory Visit (INDEPENDENT_AMBULATORY_CARE_PROVIDER_SITE_OTHER): Payer: BC Managed Care – PPO

## 2022-11-11 VITALS — BP 129/82 | HR 87 | Ht 70.0 in | Wt 243.0 lb

## 2022-11-11 DIAGNOSIS — R1030 Lower abdominal pain, unspecified: Secondary | ICD-10-CM

## 2022-11-11 DIAGNOSIS — R635 Abnormal weight gain: Secondary | ICD-10-CM

## 2022-11-11 DIAGNOSIS — R109 Unspecified abdominal pain: Secondary | ICD-10-CM | POA: Diagnosis not present

## 2022-11-11 DIAGNOSIS — I10 Essential (primary) hypertension: Secondary | ICD-10-CM | POA: Diagnosis not present

## 2022-11-11 MED ORDER — PHENTERMINE HCL 37.5 MG PO CAPS: 37.5000 mg | ORAL_CAPSULE | ORAL | 2 refills | Status: DC

## 2022-11-11 NOTE — Assessment & Plan Note (Signed)
He would like to try phentermine.  Discussed working on diet and exercise habits. He will continue to monitor his BP at home.  F/u in 6 weeks.

## 2022-11-11 NOTE — Assessment & Plan Note (Signed)
Lower abdominal and inguinal pain.  Seems to be more of a strain as no palpable hernia noted. Given degree of pain he did experience I will obtain US to evaluate for hernia.

## 2022-11-11 NOTE — Progress Notes (Addendum)
Anthony Rocha - 52 y.o. male MRN [id number]  Date of birth: 1971/08/20  Subjective Chief Complaint  Patient presents with   Possible Hernia    HPI Anthony Rocha is a 52 y.o. male here today with concern about possible hernia.   He has noticed pain and "pulling" sensation in his lower abdomen.  Symptoms started earlier this week.  Noticed when carrying groceries in.  Pain was on both sides of the lower abdomen.  Seems much better at this time, almost canceled appt because it was feeling better.  He denies changes to his bowels or urinary symptoms.   He has struggled with losing weight.  He is quite active.  Admits that diet could be better and has been working on this some.  He would like to try medication to help suppress appetite.   ROS:  A comprehensive ROS was completed and negative except as noted per HPI  No Known Allergies  Past Medical History:  Diagnosis Date   COPD (chronic obstructive pulmonary disease) (HCC)    Emphysema lung ([place])     History reviewed. No pertinent surgical history.  Social History   Socioeconomic History   Marital status: Married    Spouse name: Not on file   Number of children: Not on file   Years of education: Not on file   Highest education level: Not on file  Occupational History   Occupation: DRIVER    Employer: [employer]  Tobacco Use   Smoking status: Every Day    Packs/day: 0.50    Years: 1.00    Total pack years: 0.50    Types: Cigarettes    Last attempt to quit: 01/07/2018    Years since quitting: 4.8   Smokeless tobacco: Never   Tobacco comments:    Pt previously smoked for 30 years and quit in 2019. Restarted in 2022.  Vaping Use   Vaping Use: Never used  Substance and Sexual Activity   Alcohol use: Yes    Comment: 12 drinks/wk   Drug use: Never   Sexual activity: Yes    Partners: Female    Birth control/protection: None  Other Topics Concern   Not on file  Social History Narrative   Not  on file   Social Determinants of Health   Financial Resource Strain: Not on file  Food Insecurity: Not on file  Transportation Needs: Not on file  Physical Activity: Not on file  Stress: Not on file  Social Connections: Not on file    History reviewed. No pertinent family history.  Health Maintenance  Topic Date Due   Hepatitis C Screening  07/24/2023 (Originally 07/14/1987)   HIV Screening  07/24/2023 (Originally 07/13/1984)   COVID-19 Vaccine (1) 11/27/2023 (Originally 01/11/1970)   Zoster Vaccines- Shingrix (1 of 2) 02/09/2024 (Originally 07/14/2019)   DTaP/Tdap/Td (2 - Td or Tdap) 01/18/2029   INFLUENZA VACCINE  Completed   HPV VACCINES  Aged Out   COLONOSCOPY (Pts 45-9yr Insurance coverage will need to be confirmed)  Discontinued     ----------------------------------------------------------------------------------------------------------------------------------------------------------------------------------------------------------------- Physical Exam BP 129/82 (BP Location: Left Arm, Patient Position: Sitting, Cuff Size: Large)   Pulse 87   Ht 5' 10"$  (1.778 m)   Wt 243 lb (110.2 kg)   SpO2 97%   BMI 34.87 kg/m   Physical Exam Constitutional:      Appearance: Normal appearance.  HENT:     Head: Normocephalic and atraumatic.  Eyes:     General: No scleral icterus. Cardiovascular:  Rate and Rhythm: Normal rate and regular rhythm.  Pulmonary:     Effort: Pulmonary effort is normal.     Breath sounds: Normal breath sounds.  Abdominal:     General: Abdomen is flat. There is no distension.     Palpations: Abdomen is soft.     Tenderness: There is abdominal tenderness (R inguinal tenderness. No palpable hernia noted.).  Neurological:     Mental Status: He is alert.  Psychiatric:        Mood and Affect: Mood normal.        Behavior: Behavior normal.      ------------------------------------------------------------------------------------------------------------------------------------------------------------------------------------------------------------------- Assessment and Plan  Essential hypertension BP is much better controlled.  Continue current medications for management of HTN.  Encouraged continued diet and exercise changes.   Abnormal weight gain He would like to try phentermine.  Discussed working on diet and exercise habits. He will continue to monitor his BP at home.  F/u in 6 weeks.   Inguinal pain Lower abdominal and inguinal pain.  Seems to be more of a strain as no palpable hernia noted. Given degree of pain he did experience I will obtain US to evaluate for hernia.    Meds ordered this encounter  Medications   phentermine 37.5 MG capsule    Sig: Take 1 capsule (37.5 mg total) by mouth every morning.    Dispense:  30 capsule    Refill:  2    No follow-ups on file.    This visit occurred during the SARS-CoV-2 public health emergency.  Safety protocols were in place, including screening questions prior to the visit, additional usage of staff PPE, and extensive cleaning of exam room while observing appropriate contact time as indicated for disinfecting solutions.

## 2022-11-11 NOTE — Assessment & Plan Note (Signed)
BP is much better controlled.  Continue current medications for management of HTN.  Encouraged continued diet and exercise changes.

## 2022-12-21 ENCOUNTER — Encounter: Payer: Self-pay | Admitting: Family Medicine

## 2022-12-28 ENCOUNTER — Encounter: Payer: Self-pay | Admitting: Family Medicine

## 2022-12-28 ENCOUNTER — Ambulatory Visit: Payer: BC Managed Care – PPO | Admitting: Family Medicine

## 2022-12-28 VITALS — BP 120/81 | HR 76 | Ht 70.0 in | Wt 223.0 lb

## 2022-12-28 DIAGNOSIS — R351 Nocturia: Secondary | ICD-10-CM

## 2022-12-28 DIAGNOSIS — I10 Essential (primary) hypertension: Secondary | ICD-10-CM

## 2022-12-28 DIAGNOSIS — R635 Abnormal weight gain: Secondary | ICD-10-CM | POA: Diagnosis not present

## 2022-12-28 MED ORDER — TAMSULOSIN HCL 0.4 MG PO CAPS: 0.4000 mg | ORAL_CAPSULE | Freq: Every day | ORAL | 3 refills | Status: DC

## 2022-12-28 NOTE — Assessment & Plan Note (Signed)
BP is well controlled at this time.  Will continue valsartan at current strength.

## 2022-12-28 NOTE — Progress Notes (Signed)
Anthony Rocha - 52 y.o. male MRN [id number]  Date of birth: 1970-12-02  Subjective Chief Complaint  Patient presents with   Hypertension   Weight Loss    HPI Anthony Rocha is a 52 y.o. male here today for follow up visit.    Continues on valsartan for management of HTN.  This continues to work well for him.  He denies chest pain, shortness of breath, palpitations, headache or vision changes.  He has been on phentermine to assist with weight loss.  His weight is down about 20lbs since last visit.  Tolerating this well without significant side effects.   He would like to add flomax back on.  Having some increased frequency at night.  He had some left over flomax at home which seems to help.  No pain with urination.   ROS:  A comprehensive ROS was completed and negative except as noted per HPI  No Known Allergies  Past Medical History:  Diagnosis Date   COPD (chronic obstructive pulmonary disease)    Emphysema lung     History reviewed. No pertinent surgical history.  Social History   Socioeconomic History   Marital status: Married    Spouse name: Not on file   Number of children: Not on file   Years of education: Not on file   Highest education level: Not on file  Occupational History   Occupation: DRIVER    Employer: [employer]  Tobacco Use   Smoking status: Every Day    Packs/day: 0.50    Years: 1.00    Additional pack years: 0.00    Total pack years: 0.50    Types: Cigarettes    Last attempt to quit: 01/07/2018    Years since quitting: 4.9   Smokeless tobacco: Never   Tobacco comments:    Pt previously smoked for 30 years and quit in 2019. Restarted in 2022.  Vaping Use   Vaping Use: Never used  Substance and Sexual Activity   Alcohol use: Yes    Comment: 12 drinks/wk   Drug use: Never   Sexual activity: Yes    Partners: Female    Birth control/protection: None  Other Topics Concern   Not on file  Social History Narrative   Not  on file   Social Determinants of Health   Financial Resource Strain: Not on file  Food Insecurity: Not on file  Transportation Needs: Not on file  Physical Activity: Not on file  Stress: Not on file  Social Connections: Not on file    History reviewed. No pertinent family history.  Health Maintenance  Topic Date Due   Hepatitis C Screening  07/24/2023 (Originally 07/14/1987)   HIV Screening  07/24/2023 (Originally 07/13/1984)   COVID-19 Vaccine (1) 11/27/2023 (Originally 01/11/1970)   Zoster Vaccines- Shingrix (1 of 2) 02/09/2024 (Originally 07/14/2019)   INFLUENZA VACCINE  04/22/2023   DTaP/Tdap/Td (2 - Td or Tdap) 01/18/2029   HPV VACCINES  Aged Out   COLONOSCOPY (Pts 45-95yrs Insurance coverage will need to be confirmed)  Discontinued     ----------------------------------------------------------------------------------------------------------------------------------------------------------------------------------------------------------------- Physical Exam BP 120/81 (BP Location: Left Arm, Patient Position: Sitting, Cuff Size: Large)   Pulse 76   Ht 5\' 10"  (1.778 m)   Wt 223 lb (101.2 kg)   SpO2 100%   BMI 32.00 kg/m   Physical Exam Constitutional:      Appearance: Normal appearance.  HENT:     Head: Normocephalic and atraumatic.  Eyes:     General: No  scleral icterus. Cardiovascular:     Rate and Rhythm: Normal rate and regular rhythm.  Pulmonary:     Effort: Pulmonary effort is normal.     Breath sounds: Normal breath sounds.  Musculoskeletal:     Cervical back: Neck supple.  Neurological:     Mental Status: He is alert.  Psychiatric:        Mood and Affect: Mood normal.        Behavior: Behavior normal.     ------------------------------------------------------------------------------------------------------------------------------------------------------------------------------------------------------------------- Assessment and Plan  Essential  hypertension BP is well controlled at this time.  Will continue valsartan at current strength.    Abnormal weight gain He is doing quite well with phentermine at current strength.  Will continue for now.    Nocturia Normal PSA in 07/2022.  Updated flomax rx sent in.    No orders of the defined types were placed in this encounter.   No follow-ups on file.    This visit occurred during the SARS-CoV-2 public health emergency.  Safety protocols were in place, including screening questions prior to the visit, additional usage of staff PPE, and extensive cleaning of exam room while observing appropriate contact time as indicated for disinfecting solutions.

## 2022-12-28 NOTE — Assessment & Plan Note (Signed)
He is doing quite well with phentermine at current strength.  Will continue for now.

## 2022-12-28 NOTE — Assessment & Plan Note (Signed)
Normal PSA in 07/2022.  Updated flomax rx sent in.

## 2023-02-20 ENCOUNTER — Other Ambulatory Visit: Payer: Self-pay | Admitting: Family Medicine

## 2023-02-20 DIAGNOSIS — I10 Essential (primary) hypertension: Secondary | ICD-10-CM

## 2023-02-24 ENCOUNTER — Other Ambulatory Visit: Payer: Self-pay | Admitting: Family Medicine

## 2023-03-09 DIAGNOSIS — J449 Chronic obstructive pulmonary disease, unspecified: Secondary | ICD-10-CM | POA: Diagnosis not present

## 2023-03-09 DIAGNOSIS — Z87891 Personal history of nicotine dependence: Secondary | ICD-10-CM | POA: Diagnosis not present

## 2023-03-09 DIAGNOSIS — J302 Other seasonal allergic rhinitis: Secondary | ICD-10-CM | POA: Diagnosis not present

## 2023-04-01 ENCOUNTER — Ambulatory Visit: Payer: BC Managed Care – PPO | Admitting: Family Medicine

## 2023-04-20 ENCOUNTER — Ambulatory Visit: Payer: BC Managed Care – PPO | Admitting: Family Medicine

## 2023-04-20 ENCOUNTER — Encounter: Payer: Self-pay | Admitting: Family Medicine

## 2023-04-20 VITALS — BP 148/93 | HR 76 | Ht 70.0 in | Wt 205.0 lb

## 2023-04-20 DIAGNOSIS — F17210 Nicotine dependence, cigarettes, uncomplicated: Secondary | ICD-10-CM | POA: Diagnosis not present

## 2023-04-20 DIAGNOSIS — I10 Essential (primary) hypertension: Secondary | ICD-10-CM

## 2023-04-20 DIAGNOSIS — E669 Obesity, unspecified: Secondary | ICD-10-CM

## 2023-04-20 DIAGNOSIS — J449 Chronic obstructive pulmonary disease, unspecified: Secondary | ICD-10-CM | POA: Diagnosis not present

## 2023-04-20 MED ORDER — BUSPIRONE HCL 7.5 MG PO TABS: 7.5000 mg | ORAL_TABLET | Freq: Two times a day (BID) | ORAL | 0 refills | Status: DC

## 2023-04-20 MED ORDER — PHENTERMINE HCL 37.5 MG PO CAPS: 37.5000 mg | ORAL_CAPSULE | ORAL | 2 refills | Status: DC

## 2023-04-20 NOTE — Assessment & Plan Note (Signed)
Remains on stiolto daily with albuterol as needed.

## 2023-04-20 NOTE — Assessment & Plan Note (Signed)
He did well with phentermine previously and is continuing to exercise regularly with dietary changes.  We'll do 3 additional months of phentermine in addition to his diet and lifestyle changes.

## 2023-04-20 NOTE — Assessment & Plan Note (Signed)
BP elevated today. Encouraged low sodium diet.  Continue valsartan.  2 week nurse visit follow up to recheck BP.

## 2023-04-20 NOTE — Assessment & Plan Note (Signed)
He has remained quit for approximately 1 year.  Denies cravings.

## 2023-04-20 NOTE — Progress Notes (Signed)
Anthony Rocha - 52 y.o. male MRN [id number]  Date of birth: [birth year]  Subjective Chief Complaint  Patient presents with   Weight Loss   Hypertension    HPI Anthony Rocha is a 52 y.o. here today for follow up.   He reports that he is doing pretty well.  He had taken phentermine for a a few months previously and weight is down about 40lbs overall.  He has been off of medication for about 3 months.   He is exercising regularly and has made changes to diet.  He would like to do another cycle of phentermine to help with reaching his goal weight.    Remains on valsartan for management of HTN.  BP is elevated on initial check today.  He is tolerating this well at current strength.  He denies chest pain, shortness of breath, palpitations, headache or vision changes.   Remains on stiolto daily with albuterol as needed. He has been quit from smoking for about 1 year.   ROS:  A comprehensive ROS was completed and negative except as noted per HPI  No Known Allergies  Past Medical History:  Diagnosis Date   COPD (chronic obstructive pulmonary disease) (HCC)    Emphysema lung (HCC)     History reviewed. No pertinent surgical history.  Social History   Socioeconomic History   Marital status: Married    Spouse name: Not on file   Number of children: Not on file   Years of education: Not on file   Highest education level: Not on file  Occupational History   Occupation: DRIVER    Employer: [employer]  Tobacco Use   Smoking status: Former    Current packs/day: 0.00    Average packs/day: 0.5 packs/day for 1 year (0.5 ttl pk-yrs)    Types: Cigarettes    Start date: 01/07/2017    Quit date: 01/07/2018    Years since quitting: 5.2   Smokeless tobacco: Never   Tobacco comments:    Pt previously smoked for 30 years and quit in 2019. Restarted in 2022. Quit again in 2023.  Vaping Use   Vaping status: Never Used  Substance and Sexual Activity   Alcohol use: Yes     Comment: 12 drinks/wk   Drug use: Never   Sexual activity: Yes    Partners: Female    Birth control/protection: None  Other Topics Concern   Not on file  Social History Narrative   Not on file   Social Determinants of Health   Financial Resource Strain: Not on file  Food Insecurity: No Food Insecurity (05/21/2021)   Received from [facility]   Hunger Vital Sign    Worried About Running Out of Food in the Last Year: Never true    Ran Out of Food in the Last Year: Never true  Transportation Needs: Not on file  Physical Activity: Not on file  Stress: Not on file  Social Connections: Unknown (01/30/2022)   Received from [facility]   Social Network    Social Network: Not on file    History reviewed. No pertinent family history.  Health Maintenance  Topic Date Due   Hepatitis C Screening  07/24/2023 (Originally 07/14/1987)   HIV Screening  07/24/2023 (Originally 07/13/1984)   COVID-19 Vaccine (1 - 2023-24 season) 11/27/2023 (Originally 05/22/2022)   Zoster Vaccines- Shingrix (1 of 2) 02/09/2024 (Originally 07/14/2019)   INFLUENZA VACCINE  04/22/2023   DTaP/Tdap/Td (2 - Td or Tdap)  01/18/2029   HPV VACCINES  Aged Out   Colonoscopy  Discontinued     ----------------------------------------------------------------------------------------------------------------------------------------------------------------------------------------------------------------- Physical Exam BP (!) 148/93 (BP Location: Left Arm, Patient Position: Sitting, Cuff Size: Large)   Pulse 76   Ht 5\' 10"  (1.778 m)   Wt 205 lb (93 kg)   SpO2 97%   BMI 29.41 kg/m   Physical Exam Constitutional:      Appearance: Normal appearance.  HENT:     Head: Normocephalic and atraumatic.  Cardiovascular:     Rate and Rhythm: Normal rate and regular rhythm.  Pulmonary:     Effort: Pulmonary effort is normal.     Breath sounds: Normal breath sounds.  Musculoskeletal:      Cervical back: Neck supple.  Neurological:     Mental Status: He is alert.  Psychiatric:        Mood and Affect: Mood normal.        Behavior: Behavior normal.     ------------------------------------------------------------------------------------------------------------------------------------------------------------------------------------------------------------------- Assessment and Plan  Obesity (BMI 30-39.9) He did well with phentermine previously and is continuing to exercise regularly with dietary changes.  We'll do 3 additional months of phentermine in addition to his diet and lifestyle changes.    Nicotine dependence He has remained quit for approximately 1 year.  Denies cravings.   COPD, severe (HCC) Remains on stiolto daily with albuterol as needed.   Essential hypertension BP elevated today. Encouraged low sodium diet.  Continue valsartan.  2 week nurse visit follow up to recheck BP.     Meds ordered this encounter  Medications   phentermine 37.5 MG capsule    Sig: Take 1 capsule (37.5 mg total) by mouth every morning.    Dispense:  30 capsule    Refill:  2   busPIRone (BUSPAR) 7.5 MG tablet    Sig: Take 1 tablet (7.5 mg total) by mouth 2 (two) times daily.    Dispense:  180 tablet    Refill:  0    Return in about 3 months (around 07/21/2023) for F/u HTN/Weight.    This visit occurred during the SARS-CoV-2 public health emergency.  Safety protocols were in place, including screening questions prior to the visit, additional usage of staff PPE, and extensive cleaning of exam room while observing appropriate contact time as indicated for disinfecting solutions.

## 2023-05-04 ENCOUNTER — Ambulatory Visit: Payer: BC Managed Care – PPO

## 2023-05-25 ENCOUNTER — Encounter: Payer: Self-pay | Admitting: Family Medicine

## 2023-05-25 MED ORDER — PHENTERMINE HCL 37.5 MG PO TABS: 37.5000 mg | ORAL_TABLET | Freq: Every day | ORAL | 2 refills | Status: DC

## 2023-05-25 NOTE — Telephone Encounter (Signed)
Updated rx for tablets sent.

## 2023-07-22 ENCOUNTER — Encounter: Payer: Self-pay | Admitting: Family Medicine

## 2023-07-22 ENCOUNTER — Ambulatory Visit: Payer: BC Managed Care – PPO | Admitting: Family Medicine

## 2023-07-22 VITALS — BP 122/80 | HR 87 | Ht 70.0 in | Wt 191.0 lb

## 2023-07-22 DIAGNOSIS — Z125 Encounter for screening for malignant neoplasm of prostate: Secondary | ICD-10-CM

## 2023-07-22 DIAGNOSIS — Z13220 Encounter for screening for lipoid disorders: Secondary | ICD-10-CM

## 2023-07-22 DIAGNOSIS — F419 Anxiety disorder, unspecified: Secondary | ICD-10-CM

## 2023-07-22 DIAGNOSIS — R635 Abnormal weight gain: Secondary | ICD-10-CM | POA: Diagnosis not present

## 2023-07-22 DIAGNOSIS — J449 Chronic obstructive pulmonary disease, unspecified: Secondary | ICD-10-CM

## 2023-07-22 DIAGNOSIS — I10 Essential (primary) hypertension: Secondary | ICD-10-CM

## 2023-07-22 MED ORDER — VALSARTAN 160 MG PO TABS: 160.0000 mg | ORAL_TABLET | Freq: Every day | ORAL | 1 refills | Status: DC

## 2023-07-22 MED ORDER — BUSPIRONE HCL 10 MG PO TABS: 10.0000 mg | ORAL_TABLET | Freq: Two times a day (BID) | ORAL | 1 refills | Status: DC

## 2023-07-22 NOTE — Progress Notes (Signed)
Anthony Rocha - 52 y.o. male MRN [id number]  Date of birth: October 23, 1970  Subjective Chief Complaint  Patient presents with   Hypertension    HPI Anthony Rocha is a 52 y.o. male here today for follow up visit.    Reports that he is doing well.   Continues on valsartan for management of HTN.  BP is well controlled.  No side effects from medication at current strength.  Denies chest pain, shortness of breath, palpitations, headache or vision changes  Has been using phentermine to assist with weight loss.  Weight down about 50lbs overall since the February.    Would like to try an increase in buspar.   ROS:  A comprehensive ROS was completed and negative except as noted per HPI  No Known Allergies  Past Medical History:  Diagnosis Date   COPD (chronic obstructive pulmonary disease) (HCC)    Emphysema lung (HCC)     History reviewed. No pertinent surgical history.  Social History   Socioeconomic History   Marital status: Married    Spouse name: Not on file   Number of children: Not on file   Years of education: Not on file   Highest education level: Not on file  Occupational History   Occupation: DRIVER    Employer: [employer]  Tobacco Use   Smoking status: Former    Current packs/day: 0.00    Average packs/day: 0.5 packs/day for 1 year (0.5 ttl pk-yrs)    Types: Cigarettes    Start date: 01/07/2017    Quit date: 01/07/2018    Years since quitting: 5.5   Smokeless tobacco: Never   Tobacco comments:    Pt previously smoked for 30 years and quit in 2019. Restarted in 2022. Quit again in 2023.  Vaping Use   Vaping status: Never Used  Substance and Sexual Activity   Alcohol use: Yes    Comment: 12 drinks/wk   Drug use: Never   Sexual activity: Yes    Partners: Female    Birth control/protection: None  Other Topics Concern   Not on file  Social History Narrative   Not on file   Social Determinants of Health   Financial Resource  Strain: Not on file  Food Insecurity: No Food Insecurity (05/21/2021)   Received from [facility], [facility]   Hunger Vital Sign    Worried About Running Out of Food in the Last Year: Never true    Ran Out of Food in the Last Year: Never true  Transportation Needs: Not on file  Physical Activity: Not on file  Stress: Not on file  Social Connections: Unknown (01/30/2022)   Received from [facility], [facility]   Social Network    Social Network: Not on file    History reviewed. No pertinent family history.  Health Maintenance  Topic Date Due   Hepatitis C Screening  07/24/2023 (Originally 07/14/1987)   HIV Screening  07/24/2023 (Originally 07/13/1984)   COVID-19 Vaccine (1 - 2023-24 season) 08/07/2023 (Originally 05/23/2023)   Zoster Vaccines- Shingrix (1 of 2) 02/09/2024 (Originally 07/14/2019)   DTaP/Tdap/Td (2 - Td or Tdap) 01/18/2029   INFLUENZA VACCINE  Completed   HPV VACCINES  Aged Out   Colonoscopy  Discontinued     ----------------------------------------------------------------------------------------------------------------------------------------------------------------------------------------------------------------- Physical Exam BP 122/80 (BP Location: Left Arm, Patient Position: Sitting, Cuff Size: Normal)   Pulse 87   Ht 5\' 10"  (1.778 m)   Wt 191 lb (86.6 kg)   SpO2 98%  BMI 27.41 kg/m   Physical Exam Constitutional:      Appearance: Normal appearance.  Cardiovascular:     Rate and Rhythm: Normal rate and regular rhythm.  Pulmonary:     Effort: Pulmonary effort is normal.     Breath sounds: Normal breath sounds.  Neurological:     Mental Status: He is alert.  Psychiatric:        Mood and Affect: Mood normal.        Behavior: Behavior normal.      ------------------------------------------------------------------------------------------------------------------------------------------------------------------------------------------------------------------- Assessment and Plan  Essential hypertension BP is well controlled.  Continue valsartan at current strength.   Abnormal weight gain Has made changes to diet and activity since stopping phentermine.  Weight is stable.   COPD, severe (HCC) Continue current inhalers.   Anxiety Buspar increased to 10mg  BID.   Meds ordered this encounter  Medications   busPIRone (BUSPAR) 10 MG tablet    Sig: Take 1 tablet (10 mg total) by mouth 2 (two) times daily.    Dispense:  180 tablet    Refill:  1   valsartan (DIOVAN) 160 MG tablet    Sig: Take 1 tablet (160 mg total) by mouth daily.    Dispense:  90 tablet    Refill:  1    Return in about 6 months (around 01/19/2024) for Hypertension.    This visit occurred during the SARS-CoV-2 public health emergency.  Safety protocols were in place, including screening questions prior to the visit, additional usage of staff PPE, and extensive cleaning of exam room while observing appropriate contact time as indicated for disinfecting solutions.

## 2023-07-22 NOTE — Assessment & Plan Note (Signed)
Has made changes to diet and activity since stopping phentermine.  Weight is stable.

## 2023-07-22 NOTE — Assessment & Plan Note (Signed)
BP is well controlled.  Continue valsartan at current strength.

## 2023-07-22 NOTE — Assessment & Plan Note (Signed)
Buspar increased to 10mg  BID.

## 2023-07-22 NOTE — Assessment & Plan Note (Signed)
Continue current inhalers

## 2023-07-23 LAB — CBC WITH DIFFERENTIAL/PLATELET
Basophils Absolute: 0 10*3/uL (ref 0.0–0.2)
Basos: 0 %
EOS (ABSOLUTE): 0.1 10*3/uL (ref 0.0–0.4)
Eos: 1 %
Hematocrit: 46.6 % (ref 37.5–51.0)
Hemoglobin: 15.7 g/dL (ref 13.0–17.7)
Immature Grans (Abs): 0 10*3/uL (ref 0.0–0.1)
Immature Granulocytes: 0 %
Lymphocytes Absolute: 1.9 10*3/uL (ref 0.7–3.1)
Lymphs: 25 %
MCH: 29.3 pg (ref 26.6–33.0)
MCHC: 33.7 g/dL (ref 31.5–35.7)
MCV: 87 fL (ref 79–97)
Monocytes Absolute: 0.5 10*3/uL (ref 0.1–0.9)
Monocytes: 7 %
Neutrophils Absolute: 5.2 10*3/uL (ref 1.4–7.0)
Neutrophils: 67 %
Platelets: 214 10*3/uL (ref 150–450)
RBC: 5.35 x10E6/uL (ref 4.14–5.80)
RDW: 12.6 % (ref 11.6–15.4)
WBC: 7.7 10*3/uL (ref 3.4–10.8)

## 2023-07-23 LAB — CMP14+EGFR
ALT: 24 IU/L (ref 0–44)
AST: 19 IU/L (ref 0–40)
Albumin: 4.3 g/dL (ref 3.8–4.9)
Alkaline Phosphatase: 87 IU/L (ref 44–121)
BUN/Creatinine Ratio: 14 (ref 9–20)
BUN: 16 mg/dL (ref 6–24)
Bilirubin Total: 0.3 mg/dL (ref 0.0–1.2)
CO2: 26 mmol/L (ref 20–29)
Calcium: 9.7 mg/dL (ref 8.7–10.2)
Chloride: 103 mmol/L (ref 96–106)
Creatinine, Ser: 1.13 mg/dL (ref 0.76–1.27)
Globulin, Total: 2.4 g/dL (ref 1.5–4.5)
Glucose: 102 mg/dL — ABNORMAL HIGH (ref 70–99)
Potassium: 4.7 mmol/L (ref 3.5–5.2)
Sodium: 143 mmol/L (ref 134–144)
Total Protein: 6.7 g/dL (ref 6.0–8.5)
eGFR: 77 mL/min/1.73

## 2023-07-23 LAB — LIPID PANEL WITH LDL/HDL RATIO
Cholesterol, Total: 195 mg/dL (ref 100–199)
HDL: 54 mg/dL (ref >39)
LDL Chol Calc (NIH): 123 mg/dL — ABNORMAL HIGH (ref 0–99)
LDL/HDL Ratio: 2.3 ratio (ref 0.0–3.6)
Triglycerides: 102 mg/dL (ref 0–149)
VLDL Cholesterol Cal: 18 mg/dL (ref 5–40)

## 2023-07-23 LAB — PSA: Prostate Specific Ag, Serum: 3.1 ng/mL (ref 0.0–4.0)

## 2023-08-25 ENCOUNTER — Other Ambulatory Visit: Payer: Self-pay | Admitting: Family Medicine

## 2023-08-25 DIAGNOSIS — I10 Essential (primary) hypertension: Secondary | ICD-10-CM

## 2023-09-08 DIAGNOSIS — J302 Other seasonal allergic rhinitis: Secondary | ICD-10-CM | POA: Diagnosis not present

## 2023-09-08 DIAGNOSIS — Z87891 Personal history of nicotine dependence: Secondary | ICD-10-CM | POA: Diagnosis not present

## 2023-09-08 DIAGNOSIS — J449 Chronic obstructive pulmonary disease, unspecified: Secondary | ICD-10-CM | POA: Diagnosis not present

## 2023-10-20 DIAGNOSIS — Z87891 Personal history of nicotine dependence: Secondary | ICD-10-CM | POA: Diagnosis not present

## 2023-11-01 DIAGNOSIS — J986 Disorders of diaphragm: Secondary | ICD-10-CM | POA: Diagnosis not present

## 2023-11-08 ENCOUNTER — Other Ambulatory Visit: Payer: Self-pay | Admitting: Family Medicine

## 2023-12-20 ENCOUNTER — Other Ambulatory Visit: Payer: Self-pay | Admitting: Family Medicine

## 2023-12-31 ENCOUNTER — Encounter: Payer: Self-pay | Admitting: Family Medicine

## 2023-12-31 DIAGNOSIS — L720 Epidermal cyst: Secondary | ICD-10-CM

## 2024-01-12 ENCOUNTER — Ambulatory Visit: Payer: Self-pay

## 2024-01-12 NOTE — Telephone Encounter (Signed)
 Chief Complaint: Cyst rupture  Symptoms: Cyst rupture on back with pain and green drainage Frequency: Occurred last night Pertinent Negatives: Patient denies fever, chills, rash Disposition: [] ED /[] Urgent Care (no appt availability in office) / [x] Appointment(In office/virtual)/ []  [facility]/ [] Home Care/ [] Refused Recommended Disposition /[] [facility]/ []  Follow-up with PCP Additional Notes: Patient's wife called in stating patient has had a cyst on his back (see Mychart message for photos) for roughly 1 year that has recently changed in color (now darkened). Cyst ruptured randomly last night with a lot of green discharge. Patient states it was very painful and he is still having pain around the site. Patient has it clean and covered, and advised to keep it clean and covered and monitor for s/s of infection. Patient had upcoming appt for HTN check next week, this RN assisted patient with moving appt to this Friday for HTN check and evaluation of cyst rupture.    Copied from CRM [id number]. Topic: Clinical - Red Word Triage >> Jan 12, 2024  9:57 AM Tisa Forester wrote: Red Word that prompted transfer to Nurse Triage: patient have Cyst that is growing sent 3 images to provider on 12/31/23 on patient on his back last night the cyst rupture with pain and green coming out  Lynnie Saucier (wife [phone] )on phone is on dpr Reason for Disposition  Boil > 1/2 inch across (> 12 mm; larger than a marble)  Answer Assessment - Initial Assessment Questions 1. APPEARANCE of BOIL: "What does the boil look like?"      Was a cyst but ruptured last night  2. LOCATION: "Where is the boil located?"      Back 3. NUMBER: "How many boils are there?"      1 4. SIZE: "How big is the boil?" (e.g., inches, cm; compare to size of a coin or other object)     About the size of a marble but has now popped 5. ONSET: "When did the boil start?"     Has had cyst for a year 6. PAIN: "Is there any pain?" If  Yes, ask: "How bad is the pain?"   (Scale 1-10; or mild, moderate, severe)     Was painful before rupturing 7. FEVER: "Do you have a fever?" If Yes, ask: "What is it, how was it measured, and when did it start?"      No 8. SOURCE: "Have you been around anyone with boils or other Staph infections?" "Have you ever had boils before?"     N/a  9. OTHER SYMPTOMS: "Do you have any other symptoms?" (e.g., shaking chills, weakness, rash elsewhere on body)     No  Protocols used: Boil (Skin Abscess)-A-AH

## 2024-01-12 NOTE — Telephone Encounter (Signed)
 Patient scheduled for 01/14/24 with Dr. Augustus Ledger

## 2024-01-14 ENCOUNTER — Ambulatory Visit: Admitting: Family Medicine

## 2024-01-14 ENCOUNTER — Encounter: Payer: Self-pay | Admitting: Family Medicine

## 2024-01-14 VITALS — BP 115/75 | HR 78 | Ht 70.0 in | Wt 195.0 lb

## 2024-01-14 DIAGNOSIS — I10 Essential (primary) hypertension: Secondary | ICD-10-CM | POA: Diagnosis not present

## 2024-01-14 DIAGNOSIS — L989 Disorder of the skin and subcutaneous tissue, unspecified: Secondary | ICD-10-CM

## 2024-01-14 DIAGNOSIS — F419 Anxiety disorder, unspecified: Secondary | ICD-10-CM | POA: Diagnosis not present

## 2024-01-14 MED ORDER — DOXYCYCLINE HYCLATE 100 MG PO TABS
100.0000 mg | ORAL_TABLET | Freq: Two times a day (BID) | ORAL | 0 refills | Status: DC
Start: 2024-01-14 — End: 2024-01-28

## 2024-01-14 MED ORDER — ESCITALOPRAM OXALATE 10 MG PO TABS: ORAL_TABLET | ORAL | 0 refills | Status: DC

## 2024-01-14 NOTE — Assessment & Plan Note (Signed)
 Doesn't feel that buspar  is effective.  Adding lexapro  10mg  nightly.

## 2024-01-14 NOTE — Progress Notes (Signed)
 Anthony Rocha - 53 y.o. male MRN [id number]  Date of birth: 10/12/1970  Subjective Chief Complaint  Patient presents with   Cyst   Hypertension    HPI Anthony Rocha is a 53 y.o. here today for follow up visit.   Continue son valsartan  for management of HTN.  BP is well controlled today.  He denies side effects at current strength. He has not had chest pain, shortness of breath, palpitations, headache or vision changes.  He has had a cyst on his back.  Referred to dermatology for removal and was told to just "leave it alone".  A few days after this visit it became inflamed and started draining.  It has drained for the past couple of days.    ROS:  A comprehensive ROS was completed and negative except as noted per HPI  No Known Allergies  Past Medical History:  Diagnosis Date   COPD (chronic obstructive pulmonary disease) (HCC)    Emphysema lung (HCC)     History reviewed. No pertinent surgical history.  Social History   Socioeconomic History   Marital status: Married    Spouse name: Not on file   Number of children: Not on file   Years of education: Not on file   Highest education level: Not on file  Occupational History   Occupation: DRIVER    Employer: [employer]  Tobacco Use   Smoking status: Former    Current packs/day: 0.00    Average packs/day: 0.5 packs/day for 1 year (0.5 ttl pk-yrs)    Types: Cigarettes    Start date: 01/07/2017    Quit date: 01/07/2018    Years since quitting: 6.0   Smokeless tobacco: Never   Tobacco comments:    Pt previously smoked for 30 years and quit in 2019. Restarted in 2022. Quit again in 2023.  Vaping Use   Vaping status: Never Used  Substance and Sexual Activity   Alcohol use: Yes    Comment: 12 drinks/wk   Drug use: Never   Sexual activity: Yes    Partners: Female    Birth control/protection: None  Other Topics Concern   Not on file  Social History Narrative   Not on file   Social Drivers of  Health   Financial Resource Strain: Not on file  Food Insecurity: No Food Insecurity (05/21/2021)   Received from [facility], [facility]   Hunger Vital Sign    Worried About Running Out of Food in the Last Year: Never true    Ran Out of Food in the Last Year: Never true  Transportation Needs: Not on file  Physical Activity: Not on file  Stress: Not on file  Social Connections: Unknown (01/30/2022)   Received from [facility], [facility]   Social Network    Social Network: Not on file    History reviewed. No pertinent family history.  Health Maintenance  Topic Date Due   Zoster Vaccines- Shingrix (1 of 2) 02/09/2024 (Originally 07/14/2019)   Pneumococcal Vaccine 32-31 Years old (2 of 2 - PCV) 01/13/2025 (Originally 02/19/2019)   Hepatitis C Screening  01/13/2025 (Originally 07/14/1987)   HIV Screening  01/13/2025 (Originally 07/13/1984)   COVID-19 Vaccine (1 - 2024-25 season) 01/29/2025 (Originally 05/23/2023)   INFLUENZA VACCINE  04/21/2024   DTaP/Tdap/Td (2 - Td or Tdap) 01/18/2029   HPV VACCINES  Aged Out   Meningococcal B Vaccine  Aged Out   Colonoscopy  Discontinued     ----------------------------------------------------------------------------------------------------------------------------------------------------------------------------------------------------------------- Physical Exam BP  115/75 (BP Location: Left Arm, Patient Position: Sitting, Cuff Size: Large)   Pulse 78   Ht 5\' 10"  (1.778 m)   Wt 195 lb (88.5 kg)   SpO2 98%   BMI 27.98 kg/m   Physical Exam Constitutional:      Appearance: Normal appearance.  HENT:     Head: Normocephalic and atraumatic.  Cardiovascular:     Rate and Rhythm: Normal rate and regular rhythm.  Pulmonary:     Effort: Pulmonary effort is normal.     Breath sounds: Normal breath sounds.  Neurological:     Mental Status: He is alert.      ------------------------------------------------------------------------------------------------------------------------------------------------------------------------------------------------------------------- Assessment and Plan  Anxiety Doesn't feel that buspar  is effective.  Adding lexapro  10mg  nightly.   Essential hypertension BP is well controlled.  Continue valsartan  at current strength.   Skin lesion Cyst recently drained.  There is small amount of surrounding cellulitis.  Adding doxycycline .  Red flags reviewed.     Meds ordered this encounter  Medications   escitalopram  (LEXAPRO ) 10 MG tablet    Sig: Take 5mg  at bedtime x7 days then increase to 10mg  at bedtime    Dispense:  90 tablet    Refill:  0   doxycycline  (VIBRA -TABS) 100 MG tablet    Sig: Take 1 tablet (100 mg total) by mouth 2 (two) times daily.    Dispense:  20 tablet    Refill:  0    No follow-ups on file.

## 2024-01-14 NOTE — Assessment & Plan Note (Signed)
 Cyst recently drained.  There is small amount of surrounding cellulitis.  Adding doxycycline .  Red flags reviewed.

## 2024-01-14 NOTE — Assessment & Plan Note (Signed)
 BP is well controlled.  Continue valsartan at current strength.

## 2024-01-19 ENCOUNTER — Ambulatory Visit: Payer: BC Managed Care – PPO | Admitting: Family Medicine

## 2024-01-27 ENCOUNTER — Other Ambulatory Visit: Payer: Self-pay | Admitting: Family Medicine

## 2024-01-27 ENCOUNTER — Telehealth: Payer: Self-pay | Admitting: Family Medicine

## 2024-01-27 NOTE — Telephone Encounter (Signed)
 Refill has been requested again, note from patient from earlier request today "Patient Comment: The cyst still has stuff in it. I wanted to try another round of antibiotics if possible." Please advise if you would like to send refill or would like another follow up scheduled. Please advise.

## 2024-01-27 NOTE — Telephone Encounter (Signed)
 Copied from CRM [id number]. Topic: Clinical - Medication Refill >> Jan 27, 2024  1:37 PM Suzette B wrote: Medication:  doxycycline  (VIBRA -TABS) 100 MG tablet  Has the patient contacted their pharmacy? No Patient's spouse is a Teacher, early years/pre at the preferred pharmacy she called in and stated that she had submitted a request for the fill via MyChart and it was denied. I advised her anytime the patient needs a refill and they're showing 0 refills please call in or have him call in to make sure his medication needs are fulfilled  This is the patient's preferred pharmacy:  [id number] - [place], [place] - [address] AT [address] [place] [address] Phone: [phone] Fax: [phone]  Is this the correct pharmacy for this prescription? Yes If no, delete pharmacy and type the correct one.   Has the prescription been filled recently? Yes  Is the patient out of the medication? Yes  Has the patient been seen for an appointment in the last year OR does the patient have an upcoming appointment? Yes  Can we respond through MyChart? Yes  Agent: Please be advised that Rx refills may take up to 3 business days. We ask that you follow-up with your pharmacy.

## 2024-01-28 ENCOUNTER — Telehealth: Payer: Self-pay

## 2024-01-28 MED ORDER — DOXYCYCLINE HYCLATE 100 MG PO TABS: 100.0000 mg | ORAL_TABLET | Freq: Two times a day (BID) | ORAL | 0 refills | Status: AC

## 2024-01-28 NOTE — Telephone Encounter (Signed)
 Copied from CRM [id number]. Topic: Clinical - Prescription Issue >> Jan 27, 2024  1:45 PM Suzette B wrote: Reason for CRM: Patient's gf/spouse Ms. Anthony Rocha [phone] called in regards to a refill she submitted via MyChart. Ms Anthony Rocha states she is a Teacher, early years/pre at the patient's preferred pharmacy and attempted to submit a refill through the pharmacy as well as MyChart. She stated that it shows the patient a received a message labeled "aftercare" and she was under the assumption that the patient was being charged for a visit that he didn't have scheduled. Advised Ms. Ammons it may have been because she had submitted a refill via MyChart. While speaking on the phone she stated that a notice came through stating the patient was denied the refill; however, I did advised the best practice for the refill of medication is to call in to the clinic and request the refill for the medication.   Ms. Anthony Rocha also stated the reason for the refill request is because the cyst the patient has still has quite a bite of inflammation in there and wasn't totally cleared. I offered to schedule another appointment, but she stated no she wanted the medication for the patient. But still needed an explanation of why he received an after care message in his MyChart account.

## 2024-02-21 ENCOUNTER — Other Ambulatory Visit: Payer: Self-pay

## 2024-02-21 DIAGNOSIS — I10 Essential (primary) hypertension: Secondary | ICD-10-CM

## 2024-02-21 MED ORDER — VALSARTAN 160 MG PO TABS: 160.0000 mg | ORAL_TABLET | Freq: Every day | ORAL | 1 refills | Status: DC

## 2024-03-21 ENCOUNTER — Encounter: Payer: Self-pay | Admitting: Family Medicine

## 2024-03-22 MED ORDER — BUSPIRONE HCL 10 MG PO TABS: 10.0000 mg | ORAL_TABLET | Freq: Two times a day (BID) | ORAL | 1 refills | Status: DC

## 2024-04-10 ENCOUNTER — Other Ambulatory Visit: Payer: Self-pay | Admitting: Family Medicine

## 2024-04-14 ENCOUNTER — Ambulatory Visit: Admitting: Family Medicine

## 2024-04-14 ENCOUNTER — Encounter: Payer: Self-pay | Admitting: Family Medicine

## 2024-04-14 VITALS — BP 130/73 | HR 78 | Ht 70.0 in | Wt 209.0 lb

## 2024-04-14 DIAGNOSIS — F419 Anxiety disorder, unspecified: Secondary | ICD-10-CM | POA: Diagnosis not present

## 2024-04-14 DIAGNOSIS — R635 Abnormal weight gain: Secondary | ICD-10-CM | POA: Diagnosis not present

## 2024-04-14 MED ORDER — PHENTERMINE HCL 37.5 MG PO TABS
ORAL_TABLET | ORAL | 2 refills | Status: AC
Start: 2024-04-14 — End: ?

## 2024-04-14 MED ORDER — STIOLTO RESPIMAT 2.5-2.5 MCG/ACT IN AERS: INHALATION_SPRAY | RESPIRATORY_TRACT | 3 refills | Status: AC

## 2024-04-14 NOTE — Assessment & Plan Note (Signed)
 Did not tolerate lexapro  very well.  Doing pretty well with buspar , will continue this.

## 2024-04-14 NOTE — Assessment & Plan Note (Signed)
 Will add another round of phentermine  as he has done well with this historically.

## 2024-04-14 NOTE — Progress Notes (Signed)
 Anthony Rocha - 53 y.o. male MRN [id number]  Date of birth: 03-28-1971  Subjective Chief Complaint  Patient presents with   Anxiety    HPI Anthony Rocha is a 53 y.o. male here today for follow up visit.  He reports that he stopped lexapro  due to this making him drowsy.  He has remained on buspar  and this seems to be working pretty well for him.  Does not think he wants to add anything additional at this time.   He would like to try another round of phentermine .  This has worked well for him in the past and he has tolerated well without side effects. His job keeps him quite active. He is working on dietary changes.   ROS:  A comprehensive ROS was completed and negative except as noted per HPI  No Known Allergies  Past Medical History:  Diagnosis Date   COPD (chronic obstructive pulmonary disease) (HCC)    Emphysema lung (HCC)     No past surgical history on file.  Social History   Socioeconomic History   Marital status: Married    Spouse name: Not on file   Number of children: Not on file   Years of education: Not on file   Highest education level: Not on file  Occupational History   Occupation: DRIVER    Employer: [employer]  Tobacco Use   Smoking status: Former    Current packs/day: 0.00    Average packs/day: 0.5 packs/day for 1 year (0.5 ttl pk-yrs)    Types: Cigarettes    Start date: 01/07/2017    Quit date: 01/07/2018    Years since quitting: 6.2   Smokeless tobacco: Never   Tobacco comments:    Pt previously smoked for 30 years and quit in 2019. Restarted in 2022. Quit again in 2023.  Vaping Use   Vaping status: Never Used  Substance and Sexual Activity   Alcohol use: Yes    Comment: 12 drinks/wk   Drug use: Never   Sexual activity: Yes    Partners: Female    Birth control/protection: None  Other Topics Concern   Not on file  Social History Narrative   Not on file   Social Drivers of Health   Financial Resource Strain: Low  Risk  (04/14/2024)   Overall Financial Resource Strain (CARDIA)    Difficulty of Paying Living Expenses: Not hard at all  Food Insecurity: No Food Insecurity (04/14/2024)   Hunger Vital Sign    Worried About Running Out of Food in the Last Year: Never true    Ran Out of Food in the Last Year: Never true  Transportation Needs: No Transportation Needs (04/14/2024)   PRAPARE - Administrator, Civil Service (Medical): No    Lack of Transportation (Non-Medical): No  Physical Activity: Sufficiently Active (04/14/2024)   Exercise Vital Sign    Days of Exercise per Week: 5 days    Minutes of Exercise per Session: 30 min  Stress: No Stress Concern Present (04/14/2024)   [employer] of Occupational Health - Occupational Stress Questionnaire    Feeling of Stress: Only a little  Social Connections: Moderately Isolated (04/14/2024)   Social Connection and Isolation Panel    Frequency of Communication with Friends and Family: More than three times a week    Frequency of Social Gatherings with Friends and Family: Once a week    Attends Religious Services: Never    Database administrator or Organizations: No  Attends Banker Meetings: Never    Marital Status: Married    No family history on file.  Health Maintenance  Topic Date Due   Hepatitis B Vaccines (1 of 3 - 19+ 3-dose series) Never done   Zoster Vaccines- Shingrix (1 of 2) 07/15/2024 (Originally 07/14/2019)   Pneumococcal Vaccine 53-74 Years old (2 of 2 - PCV) 01/13/2025 (Originally 02/19/2019)   Hepatitis C Screening  01/13/2025 (Originally 07/14/1987)   HIV Screening  01/13/2025 (Originally 07/13/1984)   COVID-19 Vaccine (1 - 2024-25 season) 01/29/2025 (Originally 05/23/2023)   INFLUENZA VACCINE  04/21/2024   DTaP/Tdap/Td (2 - Td or Tdap) 01/18/2029   HPV VACCINES  Aged Out   Meningococcal B Vaccine  Aged Out   Colonoscopy  Discontinued      ----------------------------------------------------------------------------------------------------------------------------------------------------------------------------------------------------------------- Physical Exam BP 130/73 (BP Location: Left Arm, Patient Position: Sitting, Cuff Size: Normal)   Pulse 78   Ht 5' 10 (1.778 m)   Wt 209 lb (94.8 kg)   SpO2 97%   BMI 29.99 kg/m   Physical Exam Constitutional:      Appearance: Normal appearance.  Cardiovascular:     Rate and Rhythm: Normal rate and regular rhythm.  Pulmonary:     Effort: Pulmonary effort is normal.     Breath sounds: Normal breath sounds.  Neurological:     General: No focal deficit present.     Mental Status: He is alert.  Psychiatric:        Mood and Affect: Mood normal.        Behavior: Behavior normal.     ------------------------------------------------------------------------------------------------------------------------------------------------------------------------------------------------------------------- Assessment and Plan  Abnormal weight gain Will add another round of phentermine  as he has done well with this historically.    Anxiety Did not tolerate lexapro  very well.  Doing pretty well with buspar , will continue this.     Meds ordered this encounter  Medications   STIOLTO RESPIMAT  2.5-2.5 MCG/ACT AERS    Sig: INHALE 2 PUFFS VIA INHALER DAILY    Dispense:  12 g    Refill:  3   phentermine  (ADIPEX-P ) 37.5 MG tablet    Sig: One tab by mouth qAM    Dispense:  30 tablet    Refill:  2    No follow-ups on file.

## 2024-08-04 ENCOUNTER — Telehealth: Payer: Self-pay | Admitting: Family Medicine

## 2024-08-04 NOTE — Telephone Encounter (Signed)
 Copied from CRM #[id number]. Topic: Clinical - Medication Refill >> Aug 04, 2024  3:57 PM Joesph B wrote: Medication: Chantix  (patient would like to get back on it due to relapse)   Has the patient contacted their pharmacy? Yes (Agent: If no, request that the patient contact the pharmacy for the refill. If patient does not wish to contact the pharmacy document the reason why and proceed with request.) (Agent: If yes, when and what did the pharmacy advise?)  This is the patient's preferred pharmacy:  [facility] #[id number] - [place], [place] - [address] AT [address] [place] [address] Phone: [phone] Fax: [phone]  Is this the correct pharmacy for this prescription? Yes If no, delete pharmacy and type the correct one.   Has the prescription been filled recently? No  Is the patient out of the medication? Yes  Has the patient been seen for an appointment in the last year OR does the patient have an upcoming appointment? Yes  Can we respond through MyChart? Yes  Agent: Please be advised that Rx refills may take up to 3 business days. We ask that you follow-up with your pharmacy.

## 2024-08-10 MED ORDER — VARENICLINE TARTRATE 1 MG PO TABS
1.0000 mg | ORAL_TABLET | Freq: Two times a day (BID) | ORAL | 3 refills | Status: AC
Start: 2024-08-10 — End: ?

## 2024-08-10 MED ORDER — VARENICLINE TARTRATE (STARTER) 0.5 MG X 11 & 1 MG X 42 PO TBPK: ORAL_TABLET | ORAL | 0 refills | Status: AC

## 2024-08-21 ENCOUNTER — Other Ambulatory Visit: Payer: Self-pay | Admitting: Family Medicine

## 2024-08-21 DIAGNOSIS — I10 Essential (primary) hypertension: Secondary | ICD-10-CM

## 2024-08-21 NOTE — Telephone Encounter (Signed)
 Last read by Tanda MALVA Cave at 1:39PM on 08/11/2024.

## 2024-10-02 ENCOUNTER — Other Ambulatory Visit: Payer: Self-pay | Admitting: Family Medicine

## 2024-10-02 MED ORDER — BUSPIRONE HCL 10 MG PO TABS: 10.0000 mg | ORAL_TABLET | Freq: Two times a day (BID) | ORAL | 0 refills | Status: DC

## 2024-10-02 NOTE — Telephone Encounter (Signed)
 Copied from CRM #[id number]. Topic: Clinical - Medication Refill >> Oct 02, 2024  3:02 PM Emylou G wrote: Medication: busPIRone  (BUSPAR ) 10 MG tablet  Has the patient contacted their pharmacy? Yes (Agent: If no, request that the patient contact the pharmacy for the refill. If patient does not wish to contact the pharmacy document the reason why and proceed with request.) (Agent: If yes, when and what did the pharmacy advise?) called us   This is the patient's preferred pharmacy:  [id number] - [place], [place] - [address] AT [address] [place] [address] Phone: [phone] Fax: [phone]  Is this the correct pharmacy for this prescription? Yes If no, delete pharmacy and type the correct one.   Has the prescription been filled recently? No  Is the patient out of the medication? Yes  Has the patient been seen for an appointment in the last year OR does the patient have an upcoming appointment? Yes  Can we respond through MyChart? No  Agent: Please be advised that Rx refills may take up to 3 business days. We ask that you follow-up with your pharmacy.

## 2024-10-03 ENCOUNTER — Other Ambulatory Visit: Payer: Self-pay

## 2024-10-23 ENCOUNTER — Other Ambulatory Visit: Payer: Self-pay | Admitting: Family Medicine

## 2024-10-23 MED ORDER — BUSPIRONE HCL 10 MG PO TABS: 10.0000 mg | ORAL_TABLET | Freq: Two times a day (BID) | ORAL | 1 refills | Status: AC

## 2024-10-24 ENCOUNTER — Ambulatory Visit: Admitting: Family Medicine

## 2024-11-24 ENCOUNTER — Ambulatory Visit: Admitting: Family Medicine
